# Patient Record
Sex: Female | Born: 1945
Health system: Southern US, Community
[De-identification: ages and names within clinical notes are randomized; demographics above are authoritative.]

## PROBLEM LIST (undated history)

## (undated) DIAGNOSIS — K219 Gastro-esophageal reflux disease without esophagitis: Secondary | ICD-10-CM

## (undated) DIAGNOSIS — M069 Rheumatoid arthritis, unspecified: Secondary | ICD-10-CM

## (undated) DIAGNOSIS — F329 Major depressive disorder, single episode, unspecified: Secondary | ICD-10-CM

## (undated) DIAGNOSIS — K228 Other specified diseases of esophagus: Secondary | ICD-10-CM

## (undated) DIAGNOSIS — K2289 Other specified disease of esophagus: Secondary | ICD-10-CM

## (undated) DIAGNOSIS — F32A Depression, unspecified: Secondary | ICD-10-CM

## (undated) DIAGNOSIS — K449 Diaphragmatic hernia without obstruction or gangrene: Secondary | ICD-10-CM

## (undated) HISTORY — DX: Diaphragmatic hernia without obstruction or gangrene: K44.9

## (undated) HISTORY — DX: Gastro-esophageal reflux disease without esophagitis: K21.9

## (undated) HISTORY — DX: Rheumatoid arthritis, unspecified: M06.9

## (undated) HISTORY — DX: Other specified disease of esophagus: K22.89

## (undated) HISTORY — DX: Depression, unspecified: F32.A

---
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
## 2006-10-11 HISTORY — PX: REPLACEMENT TOTAL KNEE: SUR1224

## 2017-10-10 DIAGNOSIS — I10 Essential (primary) hypertension: Secondary | ICD-10-CM

## 2017-10-10 DIAGNOSIS — Z7901 Long term (current) use of anticoagulants: Secondary | ICD-10-CM | POA: Insufficient documentation

## 2017-10-10 DIAGNOSIS — I480 Paroxysmal atrial fibrillation: Secondary | ICD-10-CM

## 2017-10-10 HISTORY — DX: Long term (current) use of anticoagulants: Z79.01

## 2017-10-10 HISTORY — DX: Paroxysmal atrial fibrillation: I48.0

## 2017-10-10 HISTORY — DX: Essential (primary) hypertension: I10

## 2018-05-01 DIAGNOSIS — R41840 Attention and concentration deficit: Secondary | ICD-10-CM | POA: Diagnosis not present

## 2018-05-01 DIAGNOSIS — I10 Essential (primary) hypertension: Secondary | ICD-10-CM | POA: Diagnosis not present

## 2018-05-01 DIAGNOSIS — R110 Nausea: Secondary | ICD-10-CM | POA: Diagnosis not present

## 2018-05-01 DIAGNOSIS — F329 Major depressive disorder, single episode, unspecified: Secondary | ICD-10-CM | POA: Diagnosis not present

## 2018-05-01 DIAGNOSIS — Z79899 Other long term (current) drug therapy: Secondary | ICD-10-CM | POA: Diagnosis not present

## 2018-05-01 DIAGNOSIS — R7303 Prediabetes: Secondary | ICD-10-CM | POA: Diagnosis not present

## 2018-05-01 DIAGNOSIS — E7800 Pure hypercholesterolemia, unspecified: Secondary | ICD-10-CM | POA: Diagnosis not present

## 2018-05-16 DIAGNOSIS — E7800 Pure hypercholesterolemia, unspecified: Secondary | ICD-10-CM | POA: Diagnosis not present

## 2018-05-16 DIAGNOSIS — K219 Gastro-esophageal reflux disease without esophagitis: Secondary | ICD-10-CM | POA: Diagnosis not present

## 2018-05-16 DIAGNOSIS — I10 Essential (primary) hypertension: Secondary | ICD-10-CM | POA: Diagnosis not present

## 2018-05-16 DIAGNOSIS — F329 Major depressive disorder, single episode, unspecified: Secondary | ICD-10-CM | POA: Diagnosis not present

## 2019-04-06 DIAGNOSIS — M1712 Unilateral primary osteoarthritis, left knee: Secondary | ICD-10-CM | POA: Diagnosis not present

## 2019-04-06 DIAGNOSIS — M545 Low back pain: Secondary | ICD-10-CM | POA: Diagnosis not present

## 2019-05-24 DIAGNOSIS — I340 Nonrheumatic mitral (valve) insufficiency: Secondary | ICD-10-CM | POA: Diagnosis not present

## 2019-05-24 DIAGNOSIS — J811 Chronic pulmonary edema: Secondary | ICD-10-CM | POA: Diagnosis not present

## 2019-05-24 DIAGNOSIS — R0789 Other chest pain: Secondary | ICD-10-CM | POA: Diagnosis not present

## 2019-05-24 DIAGNOSIS — I10 Essential (primary) hypertension: Secondary | ICD-10-CM | POA: Diagnosis not present

## 2019-05-24 DIAGNOSIS — K449 Diaphragmatic hernia without obstruction or gangrene: Secondary | ICD-10-CM | POA: Diagnosis not present

## 2019-05-24 DIAGNOSIS — R918 Other nonspecific abnormal finding of lung field: Secondary | ICD-10-CM | POA: Diagnosis not present

## 2019-05-24 DIAGNOSIS — F419 Anxiety disorder, unspecified: Secondary | ICD-10-CM | POA: Diagnosis not present

## 2019-05-24 DIAGNOSIS — K219 Gastro-esophageal reflux disease without esophagitis: Secondary | ICD-10-CM | POA: Diagnosis not present

## 2019-05-24 DIAGNOSIS — R0602 Shortness of breath: Secondary | ICD-10-CM | POA: Diagnosis not present

## 2019-05-24 DIAGNOSIS — R079 Chest pain, unspecified: Secondary | ICD-10-CM | POA: Diagnosis not present

## 2019-05-24 DIAGNOSIS — R0600 Dyspnea, unspecified: Secondary | ICD-10-CM | POA: Diagnosis not present

## 2019-05-24 DIAGNOSIS — Z79899 Other long term (current) drug therapy: Secondary | ICD-10-CM | POA: Diagnosis not present

## 2019-05-24 DIAGNOSIS — I501 Left ventricular failure: Secondary | ICD-10-CM | POA: Diagnosis not present

## 2019-05-24 DIAGNOSIS — F418 Other specified anxiety disorders: Secondary | ICD-10-CM | POA: Diagnosis not present

## 2019-05-24 DIAGNOSIS — Z03818 Encounter for observation for suspected exposure to other biological agents ruled out: Secondary | ICD-10-CM | POA: Diagnosis not present

## 2019-05-24 DIAGNOSIS — I4891 Unspecified atrial fibrillation: Secondary | ICD-10-CM | POA: Diagnosis not present

## 2019-05-24 DIAGNOSIS — M069 Rheumatoid arthritis, unspecified: Secondary | ICD-10-CM | POA: Diagnosis not present

## 2019-05-24 DIAGNOSIS — E7800 Pure hypercholesterolemia, unspecified: Secondary | ICD-10-CM | POA: Diagnosis not present

## 2019-05-25 DIAGNOSIS — K219 Gastro-esophageal reflux disease without esophagitis: Secondary | ICD-10-CM | POA: Diagnosis not present

## 2019-05-25 DIAGNOSIS — Z79899 Other long term (current) drug therapy: Secondary | ICD-10-CM | POA: Diagnosis not present

## 2019-05-25 DIAGNOSIS — I501 Left ventricular failure: Secondary | ICD-10-CM | POA: Diagnosis not present

## 2019-05-25 DIAGNOSIS — R079 Chest pain, unspecified: Secondary | ICD-10-CM | POA: Diagnosis not present

## 2019-05-25 DIAGNOSIS — I4891 Unspecified atrial fibrillation: Secondary | ICD-10-CM | POA: Diagnosis not present

## 2019-05-25 DIAGNOSIS — I340 Nonrheumatic mitral (valve) insufficiency: Secondary | ICD-10-CM | POA: Diagnosis not present

## 2019-05-25 DIAGNOSIS — F419 Anxiety disorder, unspecified: Secondary | ICD-10-CM | POA: Diagnosis not present

## 2019-05-25 DIAGNOSIS — E7800 Pure hypercholesterolemia, unspecified: Secondary | ICD-10-CM | POA: Diagnosis not present

## 2019-05-25 DIAGNOSIS — R0600 Dyspnea, unspecified: Secondary | ICD-10-CM | POA: Diagnosis not present

## 2019-05-25 DIAGNOSIS — M069 Rheumatoid arthritis, unspecified: Secondary | ICD-10-CM | POA: Diagnosis not present

## 2019-05-25 DIAGNOSIS — F418 Other specified anxiety disorders: Secondary | ICD-10-CM | POA: Diagnosis not present

## 2019-05-25 DIAGNOSIS — J811 Chronic pulmonary edema: Secondary | ICD-10-CM | POA: Diagnosis not present

## 2019-05-25 DIAGNOSIS — I10 Essential (primary) hypertension: Secondary | ICD-10-CM | POA: Diagnosis not present

## 2019-05-26 DIAGNOSIS — M069 Rheumatoid arthritis, unspecified: Secondary | ICD-10-CM | POA: Diagnosis not present

## 2019-05-26 DIAGNOSIS — Z79899 Other long term (current) drug therapy: Secondary | ICD-10-CM | POA: Diagnosis not present

## 2019-05-26 DIAGNOSIS — I4891 Unspecified atrial fibrillation: Secondary | ICD-10-CM | POA: Diagnosis not present

## 2019-05-26 DIAGNOSIS — K219 Gastro-esophageal reflux disease without esophagitis: Secondary | ICD-10-CM | POA: Diagnosis not present

## 2019-05-26 DIAGNOSIS — F419 Anxiety disorder, unspecified: Secondary | ICD-10-CM | POA: Diagnosis not present

## 2019-05-26 DIAGNOSIS — F418 Other specified anxiety disorders: Secondary | ICD-10-CM | POA: Diagnosis not present

## 2019-05-26 DIAGNOSIS — J811 Chronic pulmonary edema: Secondary | ICD-10-CM | POA: Diagnosis not present

## 2019-05-26 DIAGNOSIS — R079 Chest pain, unspecified: Secondary | ICD-10-CM | POA: Diagnosis not present

## 2019-05-26 DIAGNOSIS — I501 Left ventricular failure: Secondary | ICD-10-CM | POA: Diagnosis not present

## 2019-05-26 DIAGNOSIS — R0600 Dyspnea, unspecified: Secondary | ICD-10-CM | POA: Diagnosis not present

## 2019-05-26 DIAGNOSIS — I10 Essential (primary) hypertension: Secondary | ICD-10-CM | POA: Diagnosis not present

## 2019-05-26 DIAGNOSIS — E7800 Pure hypercholesterolemia, unspecified: Secondary | ICD-10-CM | POA: Diagnosis not present

## 2019-06-05 DIAGNOSIS — Z6837 Body mass index (BMI) 37.0-37.9, adult: Secondary | ICD-10-CM | POA: Diagnosis not present

## 2019-06-05 DIAGNOSIS — G8929 Other chronic pain: Secondary | ICD-10-CM | POA: Diagnosis not present

## 2019-06-05 DIAGNOSIS — Z79899 Other long term (current) drug therapy: Secondary | ICD-10-CM | POA: Diagnosis not present

## 2019-06-05 DIAGNOSIS — E669 Obesity, unspecified: Secondary | ICD-10-CM | POA: Diagnosis not present

## 2019-06-05 DIAGNOSIS — I4891 Unspecified atrial fibrillation: Secondary | ICD-10-CM | POA: Diagnosis not present

## 2019-06-05 DIAGNOSIS — F419 Anxiety disorder, unspecified: Secondary | ICD-10-CM | POA: Diagnosis not present

## 2019-06-05 DIAGNOSIS — R5383 Other fatigue: Secondary | ICD-10-CM | POA: Diagnosis not present

## 2019-06-28 ENCOUNTER — Ambulatory Visit: Payer: Medicare PPO | Admitting: Cardiology

## 2019-07-24 ENCOUNTER — Ambulatory Visit: Payer: Medicare PPO | Admitting: Cardiology

## 2019-08-13 ENCOUNTER — Ambulatory Visit: Payer: Medicare PPO | Admitting: Cardiology

## 2019-08-14 ENCOUNTER — Other Ambulatory Visit: Payer: Self-pay | Admitting: *Deleted

## 2019-08-14 ENCOUNTER — Encounter: Payer: Self-pay | Admitting: *Deleted

## 2019-08-14 ENCOUNTER — Encounter: Payer: Self-pay | Admitting: Cardiology

## 2019-08-14 DIAGNOSIS — K228 Other specified diseases of esophagus: Secondary | ICD-10-CM | POA: Insufficient documentation

## 2019-08-14 DIAGNOSIS — K449 Diaphragmatic hernia without obstruction or gangrene: Secondary | ICD-10-CM | POA: Insufficient documentation

## 2019-08-14 DIAGNOSIS — K219 Gastro-esophageal reflux disease without esophagitis: Secondary | ICD-10-CM | POA: Insufficient documentation

## 2019-08-14 DIAGNOSIS — K2289 Other specified disease of esophagus: Secondary | ICD-10-CM | POA: Insufficient documentation

## 2019-08-14 DIAGNOSIS — M069 Rheumatoid arthritis, unspecified: Secondary | ICD-10-CM | POA: Insufficient documentation

## 2019-08-14 DIAGNOSIS — F32A Depression, unspecified: Secondary | ICD-10-CM | POA: Insufficient documentation

## 2019-08-14 DIAGNOSIS — F329 Major depressive disorder, single episode, unspecified: Secondary | ICD-10-CM | POA: Insufficient documentation

## 2019-08-15 ENCOUNTER — Ambulatory Visit (INDEPENDENT_AMBULATORY_CARE_PROVIDER_SITE_OTHER): Payer: Medicare PPO | Admitting: Cardiology

## 2019-08-15 ENCOUNTER — Encounter: Payer: Self-pay | Admitting: Cardiology

## 2019-08-15 ENCOUNTER — Other Ambulatory Visit: Payer: Self-pay

## 2019-08-15 ENCOUNTER — Encounter: Payer: Self-pay | Admitting: *Deleted

## 2019-08-15 VITALS — BP 124/84 | HR 69 | Ht 63.75 in | Wt 225.0 lb

## 2019-08-15 DIAGNOSIS — R5383 Other fatigue: Secondary | ICD-10-CM | POA: Diagnosis not present

## 2019-08-15 DIAGNOSIS — I10 Essential (primary) hypertension: Secondary | ICD-10-CM

## 2019-08-15 DIAGNOSIS — E669 Obesity, unspecified: Secondary | ICD-10-CM

## 2019-08-15 DIAGNOSIS — I480 Paroxysmal atrial fibrillation: Secondary | ICD-10-CM

## 2019-08-15 DIAGNOSIS — E782 Mixed hyperlipidemia: Secondary | ICD-10-CM

## 2019-08-15 DIAGNOSIS — R0602 Shortness of breath: Secondary | ICD-10-CM | POA: Diagnosis not present

## 2019-08-15 DIAGNOSIS — I340 Nonrheumatic mitral (valve) insufficiency: Secondary | ICD-10-CM

## 2019-08-15 MED ORDER — POTASSIUM CHLORIDE CRYS ER 20 MEQ PO TBCR: EXTENDED_RELEASE_TABLET | ORAL | 1 refills | Status: DC

## 2019-08-15 MED ORDER — FUROSEMIDE 20 MG PO TABS: ORAL_TABLET | ORAL | 1 refills | Status: DC

## 2019-08-15 NOTE — Patient Instructions (Signed)
Medication Instructions:  Your physician has recommended you make the following change in your medication:   START: Furosemide 20 mg Take 1 tab on Tues ,Thurs And Saturday x 2 weeks START: Potassium 20 meq Take 1 tab on Tues, Thurs and Saturday x 2 weeks  TAKE: Additional Toprol XL 25 mg if Heart rate is greater than 120. Check BP first to be sure top number is greater than 110  *If you need a refill on your cardiac medications before your next appointment, please call your pharmacy*  Lab Work: Your physician recommends that you return for lab work in: TODAY BMP,CBC,TSH,Liver  If you have labs (blood work) drawn today and your tests are completely normal, you will receive your results only by: Marland Kitchen MyChart Message (if you have MyChart) OR . A paper copy in the mail If you have any lab test that is abnormal or we need to change your treatment, we will call you to review the results.  Testing/Procedures: Your physician has recommended that you have a Cardioversion (DCCV). Electrical Cardioversion uses a jolt of electricity to your heart either through paddles or wired patches attached to your chest. This is a controlled, usually prescheduled, procedure. Defibrillation is done under light anesthesia in the hospital, and you usually go home the day of the procedure. This is done to get your heart back into a normal rhythm. You are not awake for the procedure. Please see the instruction sheet given to you today.  Dear Penny Hines You are scheduled for a TEE/Cardioversion/TEE Cardioversion at [facility] with Dr. Bettina Gavia.  Please arrive at the [facility] at the time you are instructed when we call you.  DIET: Nothing to eat or drink after midnight except a sip of water with medications (see medication instructions below)  Medication Instructions:   Continue your anticoagulant: ELIQUIS 5 mg You will need to continue your anticoagulant after your procedure until you  are told by your   Provider that it is safe to stop   Labs: None needed   You must have a responsible person to drive you home and stay in the waiting area during your procedure. Failure to do so could result in cancellation.  Bring your insurance cards.  *Special Note: Every effort is made to have your procedure done on time. Occasionally there are emergencies that occur at the hospital that may cause delays. Please be patient if a delay does occur.   Pulmonary function tests will be scheduled at [place] will call you with appointment time and date.  Follow-Up: At [facility], you and your health needs are our priority.  As part of our continuing mission to provide you with exceptional heart care, we have created designated Provider Care Teams.  These Care Teams include your primary Cardiologist (physician) and Advanced Practice Providers (APPs -  Physician Assistants and Nurse Practitioners) who all work together to provide you with the care you need, when you need it.  Your next appointment:   1 month  The format for your next appointment:   In Person  Provider:   Berniece Salines, DO  Other Instructions  Electrical Cardioversion  Electrical cardioversion is the delivery of a jolt of electricity to restore a normal rhythm to the heart. A rhythm that is too fast or is not regular keeps the heart from pumping well. In this procedure, sticky patches or metal paddles are placed on the chest to deliver electricity to the heart from a device. This procedure  may be done in an emergency if:  There is low or no blood pressure as a result of the heart rhythm.  Normal rhythm must be restored as fast as possible to protect the brain and heart from further damage.  It may save a life. This procedure may also be done for irregular or fast heart rhythms that are not immediately life-threatening. Tell a health care provider about:  Any allergies you have.  All medicines you are taking,  including vitamins, herbs, eye drops, creams, and over-the-counter medicines.  Any problems you or family members have had with anesthetic medicines.  Any blood disorders you have.  Any surgeries you have had.  Any medical conditions you have.  Whether you are pregnant or may be pregnant. What are the risks? Generally, this is a safe procedure. However, problems may occur, including:  Allergic reactions to medicines.  A blood clot that breaks free and travels to other parts of your body.  The possible return of an abnormal heart rhythm within hours or days after the procedure.  Your heart stopping (cardiac arrest). This is rare. What happens before the procedure? Medicines  Your health care provider may have you start taking: ? Blood-thinning medicines (anticoagulants) so your blood does not clot as easily. ? Medicines may be given to help stabilize your heart rate and rhythm.  Ask your health care provider about changing or stopping your regular medicines. This is especially important if you are taking diabetes medicines or blood thinners. General instructions  Plan to have someone take you home from the hospital or clinic.  If you will be going home right after the procedure, plan to have someone with you for 24 hours.  Follow instructions from your health care provider about eating or drinking restrictions. What happens during the procedure?  To lower your risk of infection: ? Your health care team will wash or sanitize their hands. ? Your skin will be washed with soap.  An IV tube will be inserted into one of your veins.  You will be given a medicine to help you relax (sedative).  Sticky patches (electrodes) or metal paddles may be placed on your chest.  An electrical shock will be delivered. The procedure may vary among health care providers and hospitals. What happens after the procedure?   Your blood pressure, heart rate, breathing rate, and blood oxygen  level will be monitored until the medicines you were given have worn off.  Do not drive for 24 hours if you were given a sedative.  Your heart rhythm will be watched to make sure it does not change. This information is not intended to replace advice given to you by your health care provider. Make sure you discuss any questions you have with your health care provider. Document Released: 09/17/2002 Document Revised: 09/09/2017 Document Reviewed: 04/02/2016 Elsevier Patient Education  2020 [employer].

## 2019-08-15 NOTE — Progress Notes (Signed)
o Cardiology Office Note:    Date:  08/15/2019   ID:  Penny Hines, DOB 11-28-1945, MRN [id number]  PCP:  Penny Guest, NP  Cardiologist:  Berniece Salines, DO  Electrophysiologist:  None   Referring MD: Ernestene Kiel, MD   Chief Complaint  Patient presents with   Atrial Fibrillation   History of Present Illness:    Penny Hines is a 73 y.o. female with a hx of hypertension, rheumatoid arthritis paroxysmal atrial fibrillation currently on Eliquis 5 mg twice daily, the patient was diagnosed with her atrial fibrillation in February 2019, hyperlipidemia, obesity. The patient is here today to establish cardiac care.  She was advised by her primary care physician see a cardiologist given her atrial fibrillation.  She was admitted at the [facility] back in August for atrial fibrillation with rapid ventricular rate as well as acute diastolic heart failure.  Today she tells me that at home she believes her heart rate is not well controlled. She has intermittent heart rate which is elevated in the 130s. In addition she tells me that she is short of breath when this is occurring. She denies any chest pain, lightheadedness or dizziness.   Past Medical History:  Diagnosis Date   Current use of long term anticoagulation 10/10/2017   Depression    Esophageal dilatation    Essential hypertension 10/10/2017   GERD (gastroesophageal reflux disease)    Hiatal hernia    Paroxysmal atrial fibrillation ([place]) 10/10/2017   Rheumatoid arthritis ([place])     Past Surgical History:  Procedure Laterality Date   CESAREAN SECTION  1976   REPLACEMENT TOTAL KNEE Right 2008    Current Medications: Current Meds  Medication Sig   clonazePAM (KLONOPIN) 0.5 MG tablet 0.5 mg 2 (two) times daily as needed.   diltiazem (CARDIZEM CD) 240 MG 24 hr capsule 240 mg daily.   ELIQUIS 5 MG TABS tablet Take 5 mg by mouth 2 (two) times daily.   loratadine (CLARITIN) 10 MG tablet Take  10 mg by mouth daily.   metoprolol succinate (TOPROL-XL) 50 MG 24 hr tablet Take 50 mg by mouth daily.   omeprazole (PRILOSEC) 20 MG capsule Take 20 mg by mouth daily.   pravastatin (PRAVACHOL) 10 MG tablet Take 10 mg by mouth daily.   sertraline (ZOLOFT) 100 MG tablet Take 100 mg by mouth daily.     Allergies:   Patient has no known allergies.   Social History   Socioeconomic History   Marital status: Single    Spouse name: Not on file   Number of children: Not on file   Years of education: Not on file   Highest education level: Not on file  Occupational History   Not on file  Social Needs   Financial resource strain: Not on file   Food insecurity    Worry: Not on file    Inability: Not on file   Transportation needs    Medical: Not on file    Non-medical: Not on file  Tobacco Use   Smoking status: Never Smoker   Smokeless tobacco: Never Used  Substance and Sexual Activity   Alcohol use: Yes    Comment: Occasional   Drug use: Not Currently   Sexual activity: Not on file  Lifestyle   Physical activity    Days per week: Not on file    Minutes per session: Not on file   Stress: Not on file  Relationships   Social connections  Talks on phone: Not on file    Gets together: Not on file    Attends religious service: Not on file    Active member of club or organization: Not on file    Attends meetings of clubs or organizations: Not on file    Relationship status: Not on file  Other Topics Concern   Not on file  Social History Narrative   Not on file     Family History: The patient's family history includes Diabetes in her brother and sister; Heart disease in her father; Hypertension in her father and mother; Lung cancer in her brother; Rheum arthritis in her sister; Stroke in her maternal grandmother.  ROS:   Review of Systems  Constitution: Negative for decreased appetite, fever and weight gain.  HENT: Negative for congestion, ear  discharge, hoarse voice and sore throat.   Eyes: Negative for discharge, redness, vision loss in right eye and visual halos.  Cardiovascular: Reports shortness of breath.  Negative for chest pain, leg swelling, orthopnea and palpitations.  Respiratory: Negative for cough, hemoptysis, shortness of breath and snoring.   Endocrine: Negative for heat intolerance and polyphagia.   Hematologic/Lymphatic: Negative for bleeding problem. Does not bruise/bleed easily.  Skin: Negative for flushing, nail changes, rash and suspicious lesions.  Musculoskeletal: Negative for arthritis, joint pain, muscle cramps, myalgias, neck pain and stiffness.  Gastrointestinal: Negative for abdominal pain, bowel incontinence, diarrhea and excessive appetite.  Genitourinary: Negative for decreased libido, genital sores and incomplete emptying.  Neurological: Negative for brief paralysis, focal weakness, headaches and loss of balance.  Psychiatric/Behavioral: Negative for altered mental status, depression and suicidal ideas.  Allergic/Immunologic: Negative for HIV exposure and persistent infections.    EKGs/Labs/Other Studies Reviewed:    The following studies were reviewed today:   EKG:  The ekg ordered today demonstrates atrial fibrillation with rapid ventricular rate heart rate 111 bpm.  No prior EKG for comparison  Transthoracic echocardiogram performed at [facility] on May 24, 2019 showed evidence of left atrial systolic function which is low normal.  EF 50 to 55%.  The left atrium was mildly dilated.  The right atrium was mildly enlarged.  There is moderate amount regurgitation present.  Recent Labs: No results found for requested labs within last 8760 hours.  CBC: WBC 6.7, hemoglobin 13.0, hematocrit 40.9, platelet 351 Chemistry: Glucose 111, BUN 24, creatinine 0.89, sodium 140, potassium 4.2, chloride 101, bicarb 22, calcium 10.0, total protein 7.2, albumin 4.3, total globulin 2.9, total bilirubin less  than 0.2, alk phos 7.9, AST 16, ALT 13 Recent Lipid Panel No results found for: CHOL, TRIG, HDL, CHOLHDL, VLDL, LDLCALC, LDLDIRECT  Physical Exam:    VS:  BP 124/84 (BP Location: Right Arm, Patient Position: Sitting, Cuff Size: Large)    Pulse 69    Ht 5' 3.75" (1.619 m)    Wt 225 lb (102.1 kg)    SpO2 97%    BMI 38.92 kg/m     Wt Readings from Last 3 Encounters:  08/15/19 225 lb (102.1 kg)  06/05/19 216 lb (98 kg)     GEN: Obese lady, well nourished, well developed in no acute distress HEENT: Normal NECK: No JVD; No carotid bruits LYMPHATICS: No lymphadenopathy CARDIAC: S1S2 noted,RRR, no murmurs, rubs, gallops RESPIRATORY:  Clear to auscultation without rales, wheezing or rhonchi  ABDOMEN: Soft, non-tender, non-distended, +bowel sounds, no guarding. EXTREMITIES: Trace ankle edema, No cyanosis, no clubbing MUSCULOSKELETAL: No deformity  SKIN: Warm and dry NEUROLOGIC:  Alert and oriented x  3, non-focal PSYCHIATRIC:  Normal affect, good insight  ASSESSMENT:    1. Paroxysmal atrial fibrillation (HCC)   2. Shortness of breath   3. Moderate mitral regurgitation   4. Essential hypertension   5. Obesity (BMI 30-39.9)   6. Fatigue, unspecified type   7. Mixed hyperlipidemia    PLAN:    1.  Symptomatic atrial fibrillation-I do believe that part of her symptoms is contributory the fact that she is in atrial fibrillation.  Therefore we will start with rhythm control strategy at this time.  She has been taking her Eliquis 5 mg twice daily for 2 months now with no interruption, therefrom, schedule patient for DC cardioversion at an attempt to put her back in sinus rhythm.  In addition I will get a pulmonary function test, TSH as well as LFTs in preparation for use of amiodarone if we need antiarrhythmics to maintain her sinus rhythm.  Have educated patient about this procedure and also informed her that there could be small chance that she does not convert to sinus rhythm.  She is not  fully rate control at home on her Toprol XL 50 mg and Cardizem CD to 40 mg daily.  I have advised patient that if her heart rate goes greater than 120 bpm after taking her current rate control agents to take her blood pressure as long as this is greater than 110 to take an additional Toprol-XL 25 mg.  2.  Shortness of breath-I suspect that this is likely in the setting of her symptomatic atrial fibrillation.  However due to her recent position for acute on chronic diastolic heart failure I am going to do cautious diuretics.  Therefore trial of Lasix 20 mg Tuesday Thursday and Saturdays for 2 weeks has been ordered.  In addition she will take potassium chloride 20 mEq for 2 weeks as well.  If getting back into sinus rhythm as well as the trial of diuretics does not improve his symptoms it would be reasonable at that time to rule out coronary artery disease.  3.  Moderate mitral regurgitation her echocardiogram from [facility] was reviewed, with the patient.  All her questions were answered.  Lasix as above.  4.  Hyperlipidemia patient will continue on her current dose of pravastatin.  5.  Obesity-the patient understands the need to lose weight with diet and exercise. We have discussed specific strategies for this.  6.  Blood work will be performed today.  Includes CBC, BMP, TSH, LFT.  The patient is in agreement with the above plan. The patient left the office in stable condition.  The patient will follow up in 1 month.   Medication Adjustments/Labs and Tests Ordered: Current medicines are reviewed at length with the patient today.  Concerns regarding medicines are outlined above.  Orders Placed This Encounter  Procedures   Basic Metabolic Panel (BMET)   CBC   TSH   Hepatic function panel   EKG 12-Lead   Pulmonary function test   Meds ordered this encounter  Medications   furosemide (LASIX) 20 MG tablet    Sig: Take 1 tab (20 mg) on Tues, Thurs, and Saturday for 2 weeks    Dispense:   6 tablet    Refill:  1   potassium chloride SA (KLOR-CON M20) 20 MEQ tablet    Sig: Take 1 tab 20 meq on Tues,Thurs, and Saturday x 2 weeks    Dispense:  6 tablet    Refill:  1    Patient Instructions  Medication Instructions:  Your physician has recommended you make the following change in your medication:   START: Furosemide 20 mg Take 1 tab on Tues ,Thurs And Saturday x 2 weeks START: Potassium 20 meq Take 1 tab on Tues, Thurs and Saturday x 2 weeks  TAKE: Additional Toprol XL 25 mg if Heart rate is greater than 120. Check BP first to be sure top number is greater than 110  *If you need a refill on your cardiac medications before your next appointment, please call your pharmacy*  Lab Work: Your physician recommends that you return for lab work in: [place]  If you have labs (blood work) drawn today and your tests are completely normal, you will receive your results only by:  [place] (if you have [place]) OR  A paper copy in the mail If you have any lab test that is abnormal or we need to change your treatment, we will call you to review the results.  Testing/Procedures: Your physician has recommended that you have a Cardioversion (DCCV). Electrical Cardioversion uses a jolt of electricity to your heart either through paddles or wired patches attached to your chest. This is a controlled, usually prescheduled, procedure. Defibrillation is done under light anesthesia in the hospital, and you usually go home the day of the procedure. This is done to get your heart back into a normal rhythm. You are not awake for the procedure. Please see the instruction sheet given to you today.  Dear Burnard Bunting You are scheduled for a TEE/Cardioversion/TEE Cardioversion at [facility] with Dr. Bettina Gavia.  Please arrive at the [facility] at the time you are instructed when we call you.  DIET: Nothing to eat or drink after midnight except a sip of water with  medications (see medication instructions below)  Medication Instructions:   Continue your anticoagulant: ELIQUIS 5 mg You will need to continue your anticoagulant after your procedure until you  are told by your  Provider that it is safe to stop   Labs: None needed   You must have a responsible person to drive you home and stay in the waiting area during your procedure. Failure to do so could result in cancellation.  Bring your insurance cards.  *Special Note: Every effort is made to have your procedure done on time. Occasionally there are emergencies that occur at the hospital that may cause delays. Please be patient if a delay does occur.   Pulmonary function tests will be scheduled at [place] will call you with appointment time and date.  Follow-Up: At [facility], you and your health needs are our priority.  As part of our continuing mission to provide you with exceptional heart care, we have created designated Provider Care Teams.  These Care Teams include your primary Cardiologist (physician) and Advanced Practice Providers (APPs -  Physician Assistants and Nurse Practitioners) who all work together to provide you with the care you need, when you need it.  Your next appointment:   1 month  The format for your next appointment:   In Person  Provider:   Berniece Salines, DO  Other Instructions  Electrical Cardioversion  Electrical cardioversion is the delivery of a jolt of electricity to restore a normal rhythm to the heart. A rhythm that is too fast or is not regular keeps the heart from pumping well. In this procedure, sticky patches or metal paddles are placed on the chest to deliver electricity to the heart from a device. This procedure  may be done in an emergency if:  There is low or no blood pressure as a result of the heart rhythm.  Normal rhythm must be restored as fast as possible to protect the brain and heart from further damage.  It may  save a life. This procedure may also be done for irregular or fast heart rhythms that are not immediately life-threatening. Tell a health care provider about:  Any allergies you have.  All medicines you are taking, including vitamins, herbs, eye drops, creams, and over-the-counter medicines.  Any problems you or family members have had with anesthetic medicines.  Any blood disorders you have.  Any surgeries you have had.  Any medical conditions you have.  Whether you are pregnant or may be pregnant. What are the risks? Generally, this is a safe procedure. However, problems may occur, including:  Allergic reactions to medicines.  A blood clot that breaks free and travels to other parts of your body.  The possible return of an abnormal heart rhythm within hours or days after the procedure.  Your heart stopping (cardiac arrest). This is rare. What happens before the procedure? Medicines  Your health care provider may have you start taking: ? Blood-thinning medicines (anticoagulants) so your blood does not clot as easily. ? Medicines may be given to help stabilize your heart rate and rhythm.  Ask your health care provider about changing or stopping your regular medicines. This is especially important if you are taking diabetes medicines or blood thinners. General instructions  Plan to have someone take you home from the hospital or clinic.  If you will be going home right after the procedure, plan to have someone with you for 24 hours.  Follow instructions from your health care provider about eating or drinking restrictions. What happens during the procedure?  To lower your risk of infection: ? Your health care team will wash or sanitize their hands. ? Your skin will be washed with soap.  An IV tube will be inserted into one of your veins.  You will be given a medicine to help you relax (sedative).  Sticky patches (electrodes) or metal paddles may be placed on your  chest.  An electrical shock will be delivered. The procedure may vary among health care providers and hospitals. What happens after the procedure?   Your blood pressure, heart rate, breathing rate, and blood oxygen level will be monitored until the medicines you were given have worn off.  Do not drive for 24 hours if you were given a sedative.  Your heart rhythm will be watched to make sure it does not change. This information is not intended to replace advice given to you by your health care provider. Make sure you discuss any questions you have with your health care provider. Document Released: 09/17/2002 Document Revised: 09/09/2017 Document Reviewed: 04/02/2016 Elsevier Patient Education  2020 [employer].      Adopting a Healthy Lifestyle.  Know what a healthy weight is for you (roughly BMI <25) and aim to maintain this   Aim for 7+ servings of fruits and vegetables daily   65-80+ fluid ounces of water or unsweet tea for healthy kidneys   Limit to max 1 drink of alcohol per day; avoid smoking/tobacco   Limit animal fats in diet for cholesterol and heart health - choose grass fed whenever available   Avoid highly processed foods, and foods high in saturated/trans fats   Aim for low stress - take time to unwind and care for  your mental health   Aim for 150 min of moderate intensity exercise weekly for heart health, and weights twice weekly for bone health   Aim for 7-9 hours of sleep daily   When it comes to diets, agreement about the perfect plan isnt easy to find, even among the experts. Experts at the [place] developed an idea known as the Healthy Eating Plate. Just imagine a plate divided into logical, healthy portions.   The emphasis is on diet quality:   Load up on vegetables and fruits - one-half of your plate: Aim for color and variety, and remember that potatoes dont count.   Go for whole grains - one-quarter of your plate: Whole  wheat, barley, wheat berries, quinoa, oats, brown rice, and foods made with them. If you want pasta, go with whole wheat pasta.   Protein power - one-quarter of your plate: Fish, chicken, beans, and nuts are all healthy, versatile protein sources. Limit red meat.   The diet, however, does go beyond the plate, offering a few other suggestions.   Use healthy plant oils, such as olive, canola, soy, corn, sunflower and peanut. Check the labels, and avoid partially hydrogenated oil, which have unhealthy trans fats.   If youre thirsty, drink water. Coffee and tea are good in moderation, but skip sugary drinks and limit milk and dairy products to one or two daily servings.   The type of carbohydrate in the diet is more important than the amount. Some sources of carbohydrates, such as vegetables, fruits, whole grains, and beans-are healthier than others.   Finally, stay active  Signed, Berniece Salines, DO  08/15/2019 9:48 AM    [place]

## 2019-08-16 LAB — HEPATIC FUNCTION PANEL
ALT: 10 IU/L (ref 0–32)
AST: 20 IU/L (ref 0–40)
Albumin: 4.1 g/dL (ref 3.7–4.7)
Alkaline Phosphatase: 74 IU/L (ref 39–117)
Bilirubin Total: 0.3 mg/dL (ref 0.0–1.2)
Bilirubin, Direct: 0.08 mg/dL (ref 0.00–0.40)
Total Protein: 6.8 g/dL (ref 6.0–8.5)

## 2019-08-16 LAB — BASIC METABOLIC PANEL
BUN/Creatinine Ratio: 26 (ref 12–28)
BUN: 24 mg/dL (ref 8–27)
CO2: 23 mmol/L (ref 20–29)
Calcium: 9.1 mg/dL (ref 8.7–10.3)
Chloride: 103 mmol/L (ref 96–106)
Creatinine, Ser: 0.94 mg/dL (ref 0.57–1.00)
GFR calc Af Amer: 69 mL/min/{1.73_m2} (ref >59)
GFR calc non Af Amer: 60 mL/min/{1.73_m2} (ref >59)
Glucose: 106 mg/dL — ABNORMAL HIGH (ref 65–99)
Potassium: 4.3 mmol/L (ref 3.5–5.2)
Sodium: 140 mmol/L (ref 134–144)

## 2019-08-16 LAB — CBC
Hematocrit: 37.2 % (ref 34.0–46.6)
Hemoglobin: 11.6 g/dL (ref 11.1–15.9)
MCH: 28.2 pg (ref 26.6–33.0)
MCHC: 31.2 g/dL — ABNORMAL LOW (ref 31.5–35.7)
MCV: 91 fL (ref 79–97)
Platelets: 323 10*3/uL (ref 150–450)
RBC: 4.11 x10E6/uL (ref 3.77–5.28)
RDW: 14.2 % (ref 11.7–15.4)
WBC: 5.3 10*3/uL (ref 3.4–10.8)

## 2019-08-16 LAB — TSH: TSH: 1.41 u[IU]/mL (ref 0.450–4.500)

## 2019-08-17 ENCOUNTER — Telehealth: Payer: Self-pay | Admitting: *Deleted

## 2019-08-17 NOTE — Telephone Encounter (Signed)
Telephone call to patient. Informed of appointment date and time of cardioversion at [facility] on 08/24/19 at 9 AM. She is to be there by 7 am. Nothing to eat/drink except medications after midnight. And be sure to take Eliquis as prescribed. Patient verbalized understanding

## 2019-08-20 ENCOUNTER — Telehealth: Payer: Self-pay | Admitting: *Deleted

## 2019-08-20 DIAGNOSIS — M2550 Pain in unspecified joint: Secondary | ICD-10-CM | POA: Diagnosis not present

## 2019-08-20 DIAGNOSIS — Z6838 Body mass index (BMI) 38.0-38.9, adult: Secondary | ICD-10-CM | POA: Diagnosis not present

## 2019-08-20 DIAGNOSIS — M5442 Lumbago with sciatica, left side: Secondary | ICD-10-CM | POA: Diagnosis not present

## 2019-08-20 DIAGNOSIS — Z79899 Other long term (current) drug therapy: Secondary | ICD-10-CM | POA: Diagnosis not present

## 2019-08-20 DIAGNOSIS — I4891 Unspecified atrial fibrillation: Secondary | ICD-10-CM | POA: Diagnosis not present

## 2019-08-20 DIAGNOSIS — E669 Obesity, unspecified: Secondary | ICD-10-CM | POA: Diagnosis not present

## 2019-08-20 DIAGNOSIS — F329 Major depressive disorder, single episode, unspecified: Secondary | ICD-10-CM | POA: Diagnosis not present

## 2019-08-20 DIAGNOSIS — K219 Gastro-esophageal reflux disease without esophagitis: Secondary | ICD-10-CM | POA: Diagnosis not present

## 2019-08-20 NOTE — Telephone Encounter (Signed)
Telephone call to patient. Informed of PFT's appointment at [facility] on 08/28/19 at 9:30AM. Patient verbalized understanding.

## 2019-08-20 NOTE — Telephone Encounter (Signed)
Left message to return call 

## 2019-08-21 ENCOUNTER — Telehealth: Payer: Self-pay | Admitting: *Deleted

## 2019-08-21 NOTE — Telephone Encounter (Signed)
Telephone call from patient. States was put on Lasix 20 mg TTSa for 2 weeks and not urinating any more than usual. Still SOB. Scheduled for a cardiversion 08/24/19. Please advise.

## 2019-08-24 ENCOUNTER — Telehealth: Payer: Self-pay | Admitting: Cardiology

## 2019-08-24 DIAGNOSIS — Z7901 Long term (current) use of anticoagulants: Secondary | ICD-10-CM | POA: Diagnosis not present

## 2019-08-24 DIAGNOSIS — Z79899 Other long term (current) drug therapy: Secondary | ICD-10-CM | POA: Diagnosis not present

## 2019-08-24 DIAGNOSIS — I480 Paroxysmal atrial fibrillation: Secondary | ICD-10-CM | POA: Diagnosis not present

## 2019-08-24 DIAGNOSIS — M069 Rheumatoid arthritis, unspecified: Secondary | ICD-10-CM | POA: Diagnosis not present

## 2019-08-24 DIAGNOSIS — Z539 Procedure and treatment not carried out, unspecified reason: Secondary | ICD-10-CM | POA: Diagnosis not present

## 2019-08-24 DIAGNOSIS — I10 Essential (primary) hypertension: Secondary | ICD-10-CM | POA: Diagnosis not present

## 2019-08-24 DIAGNOSIS — K219 Gastro-esophageal reflux disease without esophagitis: Secondary | ICD-10-CM | POA: Diagnosis not present

## 2019-08-24 NOTE — Telephone Encounter (Signed)
Telephone call to patient. States was out of AFib when she went for her cardioversion and they sent her home. Wants to know what to do now. Instructed her to stay on Eliquis until told by Dr Harriet Masson to stop and keep her 09/14/19 appointment. She will call if she goes back in to Afib as she knows with her phone app.Patient agreeable and had no further questions.

## 2019-08-24 NOTE — Telephone Encounter (Signed)
Patient went for Cardioversion and she was out of AFIB and they did not do, please call her she has questions regarding future issues with AFIB.

## 2019-09-04 DIAGNOSIS — D649 Anemia, unspecified: Secondary | ICD-10-CM | POA: Diagnosis not present

## 2019-09-14 ENCOUNTER — Ambulatory Visit: Payer: Medicare PPO | Admitting: Cardiology

## 2019-10-01 DIAGNOSIS — G8929 Other chronic pain: Secondary | ICD-10-CM | POA: Diagnosis not present

## 2019-10-01 DIAGNOSIS — Z79899 Other long term (current) drug therapy: Secondary | ICD-10-CM | POA: Diagnosis not present

## 2019-10-01 DIAGNOSIS — F329 Major depressive disorder, single episode, unspecified: Secondary | ICD-10-CM | POA: Diagnosis not present

## 2019-10-01 DIAGNOSIS — F419 Anxiety disorder, unspecified: Secondary | ICD-10-CM | POA: Diagnosis not present

## 2019-10-01 DIAGNOSIS — K219 Gastro-esophageal reflux disease without esophagitis: Secondary | ICD-10-CM | POA: Diagnosis not present

## 2019-10-01 DIAGNOSIS — Z6837 Body mass index (BMI) 37.0-37.9, adult: Secondary | ICD-10-CM | POA: Diagnosis not present

## 2019-11-01 DIAGNOSIS — R6883 Chills (without fever): Secondary | ICD-10-CM | POA: Diagnosis not present

## 2019-11-01 DIAGNOSIS — R519 Headache, unspecified: Secondary | ICD-10-CM | POA: Diagnosis not present

## 2019-11-01 DIAGNOSIS — R05 Cough: Secondary | ICD-10-CM | POA: Diagnosis not present

## 2019-11-01 DIAGNOSIS — Z20828 Contact with and (suspected) exposure to other viral communicable diseases: Secondary | ICD-10-CM | POA: Diagnosis not present

## 2019-11-01 DIAGNOSIS — K591 Functional diarrhea: Secondary | ICD-10-CM | POA: Diagnosis not present

## 2019-11-01 DIAGNOSIS — R0602 Shortness of breath: Secondary | ICD-10-CM | POA: Diagnosis not present

## 2020-01-01 DIAGNOSIS — D6869 Other thrombophilia: Secondary | ICD-10-CM | POA: Diagnosis not present

## 2020-01-01 DIAGNOSIS — Z79899 Other long term (current) drug therapy: Secondary | ICD-10-CM | POA: Diagnosis not present

## 2020-01-01 DIAGNOSIS — M0579 Rheumatoid arthritis with rheumatoid factor of multiple sites without organ or systems involvement: Secondary | ICD-10-CM | POA: Diagnosis not present

## 2020-01-01 DIAGNOSIS — I4891 Unspecified atrial fibrillation: Secondary | ICD-10-CM | POA: Diagnosis not present

## 2020-01-01 DIAGNOSIS — F330 Major depressive disorder, recurrent, mild: Secondary | ICD-10-CM | POA: Diagnosis not present

## 2020-01-01 DIAGNOSIS — I10 Essential (primary) hypertension: Secondary | ICD-10-CM | POA: Diagnosis not present

## 2020-01-01 DIAGNOSIS — E7800 Pure hypercholesterolemia, unspecified: Secondary | ICD-10-CM | POA: Diagnosis not present

## 2020-01-01 DIAGNOSIS — M5442 Lumbago with sciatica, left side: Secondary | ICD-10-CM | POA: Diagnosis not present

## 2020-01-01 DIAGNOSIS — F419 Anxiety disorder, unspecified: Secondary | ICD-10-CM | POA: Diagnosis not present

## 2020-01-01 DIAGNOSIS — G8929 Other chronic pain: Secondary | ICD-10-CM | POA: Diagnosis not present

## 2020-04-22 DIAGNOSIS — F419 Anxiety disorder, unspecified: Secondary | ICD-10-CM | POA: Diagnosis not present

## 2020-04-22 DIAGNOSIS — G8929 Other chronic pain: Secondary | ICD-10-CM | POA: Diagnosis not present

## 2020-04-22 DIAGNOSIS — I4891 Unspecified atrial fibrillation: Secondary | ICD-10-CM | POA: Diagnosis not present

## 2020-04-22 DIAGNOSIS — K219 Gastro-esophageal reflux disease without esophagitis: Secondary | ICD-10-CM | POA: Diagnosis not present

## 2020-04-22 DIAGNOSIS — M0579 Rheumatoid arthritis with rheumatoid factor of multiple sites without organ or systems involvement: Secondary | ICD-10-CM | POA: Diagnosis not present

## 2020-04-22 DIAGNOSIS — E7800 Pure hypercholesterolemia, unspecified: Secondary | ICD-10-CM | POA: Diagnosis not present

## 2020-04-22 DIAGNOSIS — F330 Major depressive disorder, recurrent, mild: Secondary | ICD-10-CM | POA: Diagnosis not present

## 2020-04-22 DIAGNOSIS — I10 Essential (primary) hypertension: Secondary | ICD-10-CM | POA: Diagnosis not present

## 2020-05-26 DIAGNOSIS — H40013 Open angle with borderline findings, low risk, bilateral: Secondary | ICD-10-CM | POA: Diagnosis not present

## 2020-05-26 DIAGNOSIS — H5201 Hypermetropia, right eye: Secondary | ICD-10-CM | POA: Diagnosis not present

## 2020-06-04 DIAGNOSIS — Z20822 Contact with and (suspected) exposure to covid-19: Secondary | ICD-10-CM | POA: Diagnosis not present

## 2020-06-19 ENCOUNTER — Ambulatory Visit: Payer: Medicare PPO | Admitting: Cardiology

## 2020-06-19 ENCOUNTER — Ambulatory Visit (INDEPENDENT_AMBULATORY_CARE_PROVIDER_SITE_OTHER): Payer: Medicare PPO

## 2020-06-19 ENCOUNTER — Other Ambulatory Visit: Payer: Self-pay

## 2020-06-19 ENCOUNTER — Encounter: Payer: Self-pay | Admitting: Cardiology

## 2020-06-19 VITALS — BP 116/78 | HR 72 | Ht 63.75 in | Wt 211.2 lb

## 2020-06-19 DIAGNOSIS — I10 Essential (primary) hypertension: Secondary | ICD-10-CM

## 2020-06-19 DIAGNOSIS — R42 Dizziness and giddiness: Secondary | ICD-10-CM

## 2020-06-19 DIAGNOSIS — E669 Obesity, unspecified: Secondary | ICD-10-CM

## 2020-06-19 DIAGNOSIS — I480 Paroxysmal atrial fibrillation: Secondary | ICD-10-CM

## 2020-06-19 DIAGNOSIS — R079 Chest pain, unspecified: Secondary | ICD-10-CM | POA: Diagnosis not present

## 2020-06-19 DIAGNOSIS — Z7901 Long term (current) use of anticoagulants: Secondary | ICD-10-CM

## 2020-06-19 MED ORDER — NITROGLYCERIN 0.4 MG SL SUBL: 0.4000 mg | SUBLINGUAL_TABLET | SUBLINGUAL | 3 refills | Status: DC | PRN

## 2020-06-19 NOTE — Progress Notes (Signed)
Cardiology Office Note:    Date:  06/19/2020   ID:  Penny Hines, DOB 06-11-1946, MRN [id number]  PCP:  Shirlee Latch, NP  Cardiologist:  Thomasene Ripple, DO  Electrophysiologist:  None   Referring MD: Shirlee Latch, NP   " I am doing well"  History of Present Illness:    Penny Hines is a 74 y.o. female with a hx of hypertension, rheumatoid arthritis, paroxysmal atrial fibrillation on Eliquis, hyperlipidemia and obesity.  At her last visit with plan for DC cardioversion-the patient did go for the cardioversion but was noted to be in sinus rhythm therefore the procedure was canceled.  We advised her to continue on her anticoagulation.  She is here today for follow-up visit.  She tells me today that she has been experiencing intermittent dizziness.  She notes that she has been in and out of A. fib recently but the dizziness is intolerable.  She notes that sometimes when she is sitting or even standing she gets significantly dizzy.  It resolves on its own.  In addition she has had some episodes of chest pain.  She described this as a midsternal chest wheezing and sometimes tightness.  She notes that she gets radiation to her jaw.  Once she called EMS and at that time her blood pressure was significantly elevated which eventually came down.  She was asked to go to the emergency department but she declined at that time.  Recently she had another episode of chest pain.  She states that this episode lasted longer than when she has heartburn from her hiatal hernia.  She is concerned about this.  Past Medical History:  Diagnosis Date  . Current use of long term anticoagulation 10/10/2017  . Depression   . Esophageal dilatation   . Essential hypertension 10/10/2017  . GERD (gastroesophageal reflux disease)   . Hiatal hernia   . Paroxysmal atrial fibrillation (HCC) 10/10/2017  . Rheumatoid arthritis [facility])     Past Surgical History:  Procedure Laterality Date  . CESAREAN SECTION  1976    . REPLACEMENT TOTAL KNEE Right 2008    Current Medications: Current Meds  Medication Sig  . clonazePAM (KLONOPIN) 0.5 MG tablet 0.5 mg 2 (two) times daily as needed.  . DULoxetine (CYMBALTA) 60 MG capsule Take 60 mg by mouth daily.  Marland Kitchen ELIQUIS 5 MG TABS tablet Take 5 mg by mouth daily.   . metoprolol succinate (TOPROL-XL) 50 MG 24 hr tablet Take 50 mg by mouth daily.  Marland Kitchen omeprazole (PRILOSEC) 20 MG capsule Take 20 mg by mouth daily.  . pravastatin (PRAVACHOL) 10 MG tablet Take 10 mg by mouth daily.     Allergies:   Patient has no known allergies.   Social History   Socioeconomic History  . Marital status: Single    Spouse name: Not on file  . Number of children: Not on file  . Years of education: Not on file  . Highest education level: Not on file  Occupational History  . Not on file  Tobacco Use  . Smoking status: Never Smoker  . Smokeless tobacco: Never Used  Substance and Sexual Activity  . Alcohol use: Yes    Comment: Occasional  . Drug use: Not Currently  . Sexual activity: Not on file  Other Topics Concern  . Not on file  Social History Narrative  . Not on file   Social Determinants of Health   Financial Resource Strain:   . Difficulty of Paying Living  Expenses: Not on file  Food Insecurity:   . Worried About Programme researcher, broadcasting/film/video in the Last Year: Not on file  . Ran Out of Food in the Last Year: Not on file  Transportation Needs:   . Lack of Transportation (Medical): Not on file  . Lack of Transportation (Non-Medical): Not on file  Physical Activity:   . Days of Exercise per Week: Not on file  . Minutes of Exercise per Session: Not on file  Stress:   . Feeling of Stress : Not on file  Social Connections:   . Frequency of Communication with Friends and Family: Not on file  . Frequency of Social Gatherings with Friends and Family: Not on file  . Attends Religious Services: Not on file  . Active Member of Clubs or Organizations: Not on file  . Attends Occupational hygienist Meetings: Not on file  . Marital Status: Not on file     Family History: The patient's family history includes Diabetes in her brother and sister; Heart disease in her father; Hypertension in her father and mother; Lung cancer in her brother; Rheum arthritis in her sister; Stroke in her maternal grandmother.  ROS:   Review of Systems  Constitution: Reports dizziness.  Negative for decreased appetite, fever and weight gain.  HENT: Negative for congestion, ear discharge, hoarse voice and sore throat.   Eyes: Negative for discharge, redness, vision loss in right eye and visual halos.  Cardiovascular: Reports chest pain.  Negative for dyspnea on exertion, leg swelling, orthopnea and palpitations.  Respiratory: Negative for cough, hemoptysis, shortness of breath and snoring.   Endocrine: Negative for heat intolerance and polyphagia.  Hematologic/Lymphatic: Negative for bleeding problem. Does not bruise/bleed easily.  Skin: Negative for flushing, nail changes, rash and suspicious lesions.  Musculoskeletal: Negative for arthritis, joint pain, muscle cramps, myalgias, neck pain and stiffness.  Gastrointestinal: Negative for abdominal pain, bowel incontinence, diarrhea and excessive appetite.  Genitourinary: Negative for decreased libido, genital sores and incomplete emptying.  Neurological: Negative for brief paralysis, focal weakness, headaches and loss of balance.  Psychiatric/Behavioral: Negative for altered mental status, depression and suicidal ideas.  Allergic/Immunologic: Negative for HIV exposure and persistent infections.    EKGs/Labs/Other Studies Reviewed:    The following studies were reviewed today:   EKG:  The ekg ordered today demonstrates sinus rhythm, heart rate 72 bpm.  Transthoracic echocardiogram performed at [facility] on May 24, 2019 showed evidence of left atrial systolic function which is low normal.  EF 50 to 55%.  The left atrium was mildly  dilated.  The right atrium was mildly enlarged.  There is moderate amount regurgitation present.  Recent Labs: 08/15/2019: ALT 10; BUN 24; Creatinine, Ser 0.94; Hemoglobin 11.6; Platelets 323; Potassium 4.3; Sodium 140; TSH 1.410  Recent Lipid Panel No results found for: CHOL, TRIG, HDL, CHOLHDL, VLDL, LDLCALC, LDLDIRECT  Physical Exam:    VS:  BP 116/78   Pulse 72   Ht 5' 3.75" (1.619 m)   Wt 211 lb 3.2 oz (95.8 kg)   SpO2 95%   BMI 36.54 kg/m     Wt Readings from Last 3 Encounters:  06/19/20 211 lb 3.2 oz (95.8 kg)  08/15/19 225 lb (102.1 kg)  06/05/19 216 lb (98 kg)     GEN: Well nourished, well developed in no acute distress HEENT: Normal NECK: No JVD; No carotid bruits LYMPHATICS: No lymphadenopathy CARDIAC: S1S2 noted,RRR, no murmurs, rubs, gallops RESPIRATORY:  Clear to auscultation without rales, wheezing  or rhonchi  ABDOMEN: Soft, non-tender, non-distended, +bowel sounds, no guarding. EXTREMITIES: No edema, No cyanosis, no clubbing MUSCULOSKELETAL:  No deformity  SKIN: Warm and dry NEUROLOGIC:  Alert and oriented x 3, non-focal PSYCHIATRIC:  Normal affect, good insight  ASSESSMENT:    1. Chest pain of uncertain etiology   2. Dizziness   3. Essential hypertension   4. Paroxysmal atrial fibrillation (HCC)   5. Current use of long term anticoagulation   6. Obesity (BMI 30-39.9)    PLAN:    She has been experiencing some achy chest pain.  Given her risk factors at this time going to pursue ischemic evaluation.  We have discussed testing options and we both agreed on a pharmacologic nuclear stress test.  I have answered all the question about the stress test.  Sublingual nitroglycerin prescription was sent, its protocol and 911 protocol explained and the patient vocalized understanding questions were answered to the patient's satisfaction.  She also has been experiencing dizziness which is concerning.  Given her history of atrial fibrillation like to make sure that  tachybradycardia syndrome is not a issue here.  And also 1 understand how often is she going in and out of atrial fibrillation.  Going to place a monitor on the patient for about 14 days.    She is on Eliquis twice a day, metoprolol succinate 50 mg daily for atrial fibrillation.  We will continue this for now.  She is in sinus rhythm today.  Hyperlipidemia continue patient her pravastatin.  Obesity-the patient understands the need to lose weight with diet and exercise. We have discussed specific strategies for this.  We will get BMP, mag as well as CBC.  The patient is in agreement with the above plan. The patient left the office in stable condition.  The patient will follow up in 2 months or sooner if needed.   Medication Adjustments/Labs and Tests Ordered: Current medicines are reviewed at length with the patient today.  Concerns regarding medicines are outlined above.  Orders Placed This Encounter  Procedures  . Basic metabolic panel  . CBC  . Magnesium  . MYOCARDIAL PERFUSION IMAGING  . LONG TERM MONITOR (3-14 DAYS)  . EKG 12-Lead   Meds ordered this encounter  Medications  . nitroGLYCERIN (NITROSTAT) 0.4 MG SL tablet    Sig: Place 1 tablet (0.4 mg total) under the tongue every 5 (five) minutes as needed for chest pain.    Dispense:  90 tablet    Refill:  3    Patient Instructions  Medication Instructions:  Your physician has recommended you make the following change in your medication:  START: Nitroglycerin 0.4 mg take one tablet by mouth every 5 minutes as needed for chest pain.  *If you need a refill on your cardiac medications before your next appointment, please call your pharmacy*   Lab Work: Your physician recommends that you return for lab work in: TODAY 'BMP, CBC, Mag If you have labs (blood work) drawn today and your tests are completely normal, you will receive your results only by: Marland Kitchen MyChart Message (if you have MyChart) OR . A paper copy in the mail If  you have any lab test that is abnormal or we need to change your treatment, we will call you to review the results.   Testing/Procedures: A zio monitor was ordered today. It will remain on for 14 days. You will then return monitor and event diary in provided box. It takes 1-2 weeks for report to be  downloaded and returned to [place]. We will call you with the results. If monitor falls off or has orange flashing light, please call Zio for further instructions.     [facility] Nuclear Imaging [address] [place], [place] [address] Phone:  [phone]    Please arrive 15 minutes prior to your appointment time for registration and insurance purposes.  The test will take approximately 3 to 4 hours to complete; you may bring reading material.  If someone comes with you to your appointment, they will need to remain in the main lobby due to limited space in the testing area. **If you are pregnant or breastfeeding, please notify the nuclear lab prior to your appointment**  How to prepare for your Myocardial Perfusion Test: . Do not eat or drink 3 hours prior to your test, except you may have water. . Do not consume products containing caffeine (regular or decaffeinated) 12 hours prior to your test. (ex: coffee, chocolate, sodas, tea). . Do bring a list of your current medications with you.  If not listed below, you may take your medications as normal. . Do wear comfortable clothes (no dresses or overalls) and walking shoes, tennis shoes preferred (No heels or open toe shoes are allowed). . Do NOT wear cologne, perfume, aftershave, or lotions (deodorant is allowed). . If these instructions are not followed, your test will have to be rescheduled.  Please report to [address] for your test.  If you have questions or concerns about your appointment, you can call the [facility] Lab at [phone].  If you cannot keep your appointment, please provide 24  hours notification to the Nuclear Lab, to avoid a possible $50 charge to your account.    Follow-Up: At [facility], you and your health needs are our priority.  As part of our continuing mission to provide you with exceptional heart care, we have created designated Provider Care Teams.  These Care Teams include your primary Cardiologist (physician) and Advanced Practice Providers (APPs -  Physician Assistants and Nurse Practitioners) who all work together to provide you with the care you need, when you need it.  We recommend signing up for the patient portal called "MyChart".  Sign up information is provided on this After Visit Summary.  MyChart is used to connect with patients for Virtual Visits (Telemedicine).  Patients are able to view lab/test results, encounter notes, upcoming appointments, etc.  Non-urgent messages can be sent to your provider as well.   To learn more about what you can do with MyChart, go to [email or web address].    Your next appointment:   3 month(s)  The format for your next appointment:   In Person  Provider:   Thomasene Ripple, DO   Other Instructions      Adopting a Healthy Lifestyle.  Know what a healthy weight is for you (roughly BMI <25) and aim to maintain this   Aim for 7+ servings of fruits and vegetables daily   65-80+ fluid ounces of water or unsweet tea for healthy kidneys   Limit to max 1 drink of alcohol per day; avoid smoking/tobacco   Limit animal fats in diet for cholesterol and heart health - choose grass fed whenever available   Avoid highly processed foods, and foods high in saturated/trans fats   Aim for low stress - take time to unwind and care for your mental health   Aim for 150 min of moderate intensity exercise weekly for heart  health, and weights twice weekly for bone health   Aim for 7-9 hours of sleep daily   When it comes to diets, agreement about the perfect plan isnt easy to find, even among the  experts. Experts at the [facility] [employer] developed an idea known as the Healthy Eating Plate. Just imagine a plate divided into logical, healthy portions.   The emphasis is on diet quality:   Load up on vegetables and fruits - one-half of your plate: Aim for color and variety, and remember that potatoes dont count.   Go for whole grains - one-quarter of your plate: Whole wheat, barley, wheat berries, quinoa, oats, brown rice, and foods made with them. If you want pasta, go with whole wheat pasta.   Protein power - one-quarter of your plate: Fish, chicken, beans, and nuts are all healthy, versatile protein sources. Limit red meat.   The diet, however, does go beyond the plate, offering a few other suggestions.   Use healthy plant oils, such as olive, canola, soy, corn, sunflower and peanut. Check the labels, and avoid partially hydrogenated oil, which have unhealthy trans fats.   If youre thirsty, drink water. Coffee and tea are good in moderation, but skip sugary drinks and limit milk and dairy products to one or two daily servings.   The type of carbohydrate in the diet is more important than the amount. Some sources of carbohydrates, such as vegetables, fruits, whole grains, and beans-are healthier than others.   Finally, stay active  Signed, Thomasene Ripple, DO  06/19/2020 9:16 AM    [facility]

## 2020-06-19 NOTE — Patient Instructions (Signed)
Medication Instructions:  Your physician has recommended you make the following change in your medication:  START: Nitroglycerin 0.4 mg take one tablet by mouth every 5 minutes as needed for chest pain.  *If you need a refill on your cardiac medications before your next appointment, please call your pharmacy*   Lab Work: Your physician recommends that you return for lab work in: TODAY 'BMP, CBC, Mag If you have labs (blood work) drawn today and your tests are completely normal, you will receive your results only by: Marland Kitchen MyChart Message (if you have MyChart) OR . A paper copy in the mail If you have any lab test that is abnormal or we need to change your treatment, we will call you to review the results.   Testing/Procedures: A zio monitor was ordered today. It will remain on for 14 days. You will then return monitor and event diary in provided box. It takes 1-2 weeks for report to be downloaded and returned to [place]. We will call you with the results. If monitor falls off or has orange flashing light, please call Zio for further instructions.     [facility] Nuclear Imaging [address] [place], [place] [address] Phone:  [phone]    Please arrive 15 minutes prior to your appointment time for registration and insurance purposes.  The test will take approximately 3 to 4 hours to complete; you may bring reading material.  If someone comes with you to your appointment, they will need to remain in the main lobby due to limited space in the testing area. **If you are pregnant or breastfeeding, please notify the nuclear lab prior to your appointment**  How to prepare for your Myocardial Perfusion Test: . Do not eat or drink 3 hours prior to your test, except you may have water. . Do not consume products containing caffeine (regular or decaffeinated) 12 hours prior to your test. (ex: coffee, chocolate, sodas, tea). . Do bring a list of your current medications with you.  If not  listed below, you may take your medications as normal. . Do wear comfortable clothes (no dresses or overalls) and walking shoes, tennis shoes preferred (No heels or open toe shoes are allowed). . Do NOT wear cologne, perfume, aftershave, or lotions (deodorant is allowed). . If these instructions are not followed, your test will have to be rescheduled.  Please report to [address] for your test.  If you have questions or concerns about your appointment, you can call the [facility] Imaging Lab at [phone].  If you cannot keep your appointment, please provide 24 hours notification to the Nuclear Lab, to avoid a possible $50 charge to your account.    Follow-Up: At [facility], you and your health needs are our priority.  As part of our continuing mission to provide you with exceptional heart care, we have created designated Provider Care Teams.  These Care Teams include your primary Cardiologist (physician) and Advanced Practice Providers (APPs -  Physician Assistants and Nurse Practitioners) who all work together to provide you with the care you need, when you need it.  We recommend signing up for the patient portal called "MyChart".  Sign up information is provided on this After Visit Summary.  MyChart is used to connect with patients for Virtual Visits (Telemedicine).  Patients are able to view lab/test results, encounter notes, upcoming appointments, etc.  Non-urgent messages can be sent to your provider as well.   To learn more about  what you can do with MyChart, go to [email or web address].    Your next appointment:   3 month(s)  The format for your next appointment:   In Person  Provider:   Thomasene Ripple, DO   Other Instructions

## 2020-06-20 ENCOUNTER — Telehealth: Payer: Self-pay

## 2020-06-20 LAB — BASIC METABOLIC PANEL
BUN/Creatinine Ratio: 16 (ref 12–28)
BUN: 14 mg/dL (ref 8–27)
CO2: 26 mmol/L (ref 20–29)
Calcium: 9.4 mg/dL (ref 8.7–10.3)
Chloride: 101 mmol/L (ref 96–106)
Creatinine, Ser: 0.9 mg/dL (ref 0.57–1.00)
GFR calc Af Amer: 72 mL/min/{1.73_m2} (ref >59)
GFR calc non Af Amer: 63 mL/min/{1.73_m2} (ref >59)
Glucose: 93 mg/dL (ref 65–99)
Potassium: 4.1 mmol/L (ref 3.5–5.2)
Sodium: 140 mmol/L (ref 134–144)

## 2020-06-20 LAB — CBC
Hematocrit: 38.8 % (ref 34.0–46.6)
Hemoglobin: 12.5 g/dL (ref 11.1–15.9)
MCH: 30 pg (ref 26.6–33.0)
MCHC: 32.2 g/dL (ref 31.5–35.7)
MCV: 93 fL (ref 79–97)
Platelets: 301 10*3/uL (ref 150–450)
RBC: 4.17 x10E6/uL (ref 3.77–5.28)
RDW: 13 % (ref 11.7–15.4)
WBC: 5.3 10*3/uL (ref 3.4–10.8)

## 2020-06-20 LAB — MAGNESIUM: Magnesium: 2.1 mg/dL (ref 1.6–2.3)

## 2020-06-20 NOTE — Telephone Encounter (Signed)
Spoke with patient regarding results and recommendation.  Patient verbalizes understanding and is agreeable to plan of care. Advised patient to call back with any issues or concerns.  

## 2020-06-20 NOTE — Telephone Encounter (Signed)
-----   Message from Thomasene Ripple, DO sent at 06/20/2020  8:17 AM EDT ----- Randie Heinz news, normal labs

## 2020-06-23 ENCOUNTER — Telehealth: Payer: Self-pay | Admitting: Cardiology

## 2020-06-23 NOTE — Telephone Encounter (Signed)
Penny Hines is calling stating her heart monitor fell off Saturday and she doesn't know what to do. She states she has not put it back on and was having a reaction to the tape that was causing it to itch, but is not aware that she did anything to cause it to fall off. Please advise.

## 2020-06-23 NOTE — Telephone Encounter (Signed)
Called patient. She reports she wore the monitor for 3 days and then it fell off. She also had a little bit of a reaction to it and it caused itching. She said the reaction has gotten better, if that were to worsen I did inform her to seek emergency care.    She wants to know if she can go ahead and send monitor back and we can use the date we have. Will check with Dr. Servando Salina.

## 2020-06-24 NOTE — Telephone Encounter (Signed)
Called patient informed her to go ahead and send monitor back. She verbally understood. No further questions.

## 2020-06-24 NOTE — Telephone Encounter (Signed)
That is fine too is have her mail the monitor. No need to send in an additional

## 2020-06-25 ENCOUNTER — Telehealth: Payer: Self-pay | Admitting: *Deleted

## 2020-06-25 NOTE — Telephone Encounter (Signed)
Patient given detailed instructions per Myocardial Perfusion Study Information Sheet for the test on 07/02/2020 at 0815. Patient notified to arrive 15 minutes early and that it is imperative to arrive on time for appointment to keep from having the test rescheduled.  If you need to cancel or reschedule your appointment, please call the office within 24 hours of your appointment. . Patient verbalized understanding. Amarissa Koerner, Adelene Idler No mychart available.

## 2020-07-02 ENCOUNTER — Other Ambulatory Visit: Payer: Self-pay

## 2020-07-02 ENCOUNTER — Ambulatory Visit (INDEPENDENT_AMBULATORY_CARE_PROVIDER_SITE_OTHER): Payer: Medicare PPO

## 2020-07-02 DIAGNOSIS — R079 Chest pain, unspecified: Secondary | ICD-10-CM | POA: Diagnosis not present

## 2020-07-02 DIAGNOSIS — I480 Paroxysmal atrial fibrillation: Secondary | ICD-10-CM

## 2020-07-02 DIAGNOSIS — Z7901 Long term (current) use of anticoagulants: Secondary | ICD-10-CM | POA: Diagnosis not present

## 2020-07-02 DIAGNOSIS — R42 Dizziness and giddiness: Secondary | ICD-10-CM | POA: Diagnosis not present

## 2020-07-02 DIAGNOSIS — I10 Essential (primary) hypertension: Secondary | ICD-10-CM | POA: Diagnosis not present

## 2020-07-02 LAB — MYOCARDIAL PERFUSION IMAGING
LV dias vol: 67 mL (ref 46–106)
LV sys vol: 22 mL
Peak HR: 77 {beats}/min
Rest HR: 61 {beats}/min
SDS: 2
SRS: 2
SSS: 4
TID: 1.09

## 2020-07-02 MED ORDER — TECHNETIUM TC 99M TETROFOSMIN IV KIT
32.4000 | PACK | Freq: Once | INTRAVENOUS | Status: AC | PRN
  Administered 2020-07-02: 32.4 via INTRAVENOUS

## 2020-07-02 MED ORDER — TECHNETIUM TC 99M TETROFOSMIN IV KIT
10.7000 | PACK | Freq: Once | INTRAVENOUS | Status: AC | PRN
  Administered 2020-07-02: 10.7 via INTRAVENOUS

## 2020-07-02 MED ORDER — REGADENOSON 0.4 MG/5ML IV SOLN
0.4000 mg | Freq: Once | INTRAVENOUS | Status: AC
  Administered 2020-07-02: 0.4 mg via INTRAVENOUS

## 2020-07-04 DIAGNOSIS — I480 Paroxysmal atrial fibrillation: Secondary | ICD-10-CM | POA: Diagnosis not present

## 2020-07-09 ENCOUNTER — Telehealth: Payer: Self-pay

## 2020-07-09 NOTE — Telephone Encounter (Signed)
Tried calling patient. No answer and no voicemail set up for me to leave a message. 

## 2020-07-09 NOTE — Telephone Encounter (Signed)
-----   Message from Thomasene Ripple, DO sent at 07/09/2020 11:50 AM EDT ----- Her monitor did show evidence that you do have some fast heartbeat that comes from the top of the heart.  It only happens occasionally.We can discuss more detail at your next visit.

## 2020-07-10 ENCOUNTER — Telehealth: Payer: Self-pay

## 2020-07-10 NOTE — Telephone Encounter (Signed)
-----   Message from Kardie Tobb, DO sent at 07/09/2020 11:50 AM EDT ----- Her monitor did show evidence that you do have some fast heartbeat that comes from the top of the heart.  It only happens occasionally.We can discuss more detail at your next visit. 

## 2020-07-10 NOTE — Telephone Encounter (Signed)
Spoke with patient regarding results and recommendation.  Patient verbalizes understanding and is agreeable to plan of care. Advised patient to call back with any issues or concerns.  

## 2020-07-23 DIAGNOSIS — Z1152 Encounter for screening for COVID-19: Secondary | ICD-10-CM | POA: Diagnosis not present

## 2020-07-23 DIAGNOSIS — R5383 Other fatigue: Secondary | ICD-10-CM | POA: Diagnosis not present

## 2020-08-12 DIAGNOSIS — Z6835 Body mass index (BMI) 35.0-35.9, adult: Secondary | ICD-10-CM | POA: Diagnosis not present

## 2020-08-12 DIAGNOSIS — R61 Generalized hyperhidrosis: Secondary | ICD-10-CM | POA: Diagnosis not present

## 2020-08-12 DIAGNOSIS — R7303 Prediabetes: Secondary | ICD-10-CM | POA: Diagnosis not present

## 2020-08-12 DIAGNOSIS — R5383 Other fatigue: Secondary | ICD-10-CM | POA: Diagnosis not present

## 2020-08-12 DIAGNOSIS — E7800 Pure hypercholesterolemia, unspecified: Secondary | ICD-10-CM | POA: Diagnosis not present

## 2020-08-12 DIAGNOSIS — Z0001 Encounter for general adult medical examination with abnormal findings: Secondary | ICD-10-CM | POA: Diagnosis not present

## 2020-08-12 DIAGNOSIS — Z1339 Encounter for screening examination for other mental health and behavioral disorders: Secondary | ICD-10-CM | POA: Diagnosis not present

## 2020-08-12 DIAGNOSIS — E2839 Other primary ovarian failure: Secondary | ICD-10-CM | POA: Diagnosis not present

## 2020-08-12 DIAGNOSIS — Z1159 Encounter for screening for other viral diseases: Secondary | ICD-10-CM | POA: Diagnosis not present

## 2020-08-12 DIAGNOSIS — Z1231 Encounter for screening mammogram for malignant neoplasm of breast: Secondary | ICD-10-CM | POA: Diagnosis not present

## 2020-09-23 ENCOUNTER — Ambulatory Visit: Payer: Medicare PPO | Admitting: Cardiology

## 2020-11-04 ENCOUNTER — Ambulatory Visit: Payer: Medicare PPO | Admitting: Cardiology

## 2020-11-28 DIAGNOSIS — M0579 Rheumatoid arthritis with rheumatoid factor of multiple sites without organ or systems involvement: Secondary | ICD-10-CM | POA: Diagnosis not present

## 2020-11-28 DIAGNOSIS — R5383 Other fatigue: Secondary | ICD-10-CM | POA: Diagnosis not present

## 2020-11-28 DIAGNOSIS — K219 Gastro-esophageal reflux disease without esophagitis: Secondary | ICD-10-CM | POA: Diagnosis not present

## 2020-11-28 DIAGNOSIS — L659 Nonscarring hair loss, unspecified: Secondary | ICD-10-CM | POA: Diagnosis not present

## 2020-11-28 DIAGNOSIS — D696 Thrombocytopenia, unspecified: Secondary | ICD-10-CM | POA: Diagnosis not present

## 2020-11-28 DIAGNOSIS — G8929 Other chronic pain: Secondary | ICD-10-CM | POA: Diagnosis not present

## 2020-11-28 DIAGNOSIS — E7800 Pure hypercholesterolemia, unspecified: Secondary | ICD-10-CM | POA: Diagnosis not present

## 2020-11-28 DIAGNOSIS — Z79899 Other long term (current) drug therapy: Secondary | ICD-10-CM | POA: Diagnosis not present

## 2020-11-28 DIAGNOSIS — I4891 Unspecified atrial fibrillation: Secondary | ICD-10-CM | POA: Diagnosis not present

## 2020-11-28 DIAGNOSIS — F330 Major depressive disorder, recurrent, mild: Secondary | ICD-10-CM | POA: Diagnosis not present

## 2021-02-02 DIAGNOSIS — L74519 Primary focal hyperhidrosis, unspecified: Secondary | ICD-10-CM | POA: Diagnosis not present

## 2021-02-02 DIAGNOSIS — R110 Nausea: Secondary | ICD-10-CM | POA: Diagnosis not present

## 2021-02-02 DIAGNOSIS — M069 Rheumatoid arthritis, unspecified: Secondary | ICD-10-CM | POA: Diagnosis not present

## 2021-02-02 DIAGNOSIS — F331 Major depressive disorder, recurrent, moderate: Secondary | ICD-10-CM | POA: Diagnosis not present

## 2021-02-02 DIAGNOSIS — Z6832 Body mass index (BMI) 32.0-32.9, adult: Secondary | ICD-10-CM | POA: Diagnosis not present

## 2021-02-02 DIAGNOSIS — E669 Obesity, unspecified: Secondary | ICD-10-CM | POA: Diagnosis not present

## 2021-02-02 DIAGNOSIS — I480 Paroxysmal atrial fibrillation: Secondary | ICD-10-CM | POA: Diagnosis not present

## 2021-02-09 ENCOUNTER — Telehealth: Payer: Self-pay | Admitting: Cardiology

## 2021-02-09 NOTE — Telephone Encounter (Signed)
Please set her up to see me soon

## 2021-02-09 NOTE — Telephone Encounter (Signed)
Patient c/o Palpitations:  High priority if patient c/o lightheadedness, shortness of breath, or chest pain  1) How long have you had palpitations/irregular HR/ Afib? Are you having the symptoms now? Yes, 10 days  2) Are you currently experiencing lightheadedness, SOB or CP? Has a little more SOB, but not audibly SOB on the phone  3) Do you have a history of afib (atrial fibrillation) or irregular heart rhythm? afib  4) Have you checked your BP or HR? (document readings if available): today 114, usually HR is 130-150's when in afib, BP was checked last Monday and was good  5) Are you experiencing any other symptoms? Feels woozy, and "just feels bad all over"  Patient states she has been in afib for 10 days and is still having it now. She states she "just feels bad all over". She states her HR has not been high today, but usually when she is in afib it will be in 130-150's. She states her PCP last Monday heard her afib and states her BP that day looked good. She states she is not sure what she is supposed to do when she is in afib. She states she will sometimes go out of it for long periods of time. She states she realizes she has to live with it, but is not sure if there are things she needs to be doing. She states she also slipped and fell the other night and is not sure if that has anything to do with her feeling bad.

## 2021-02-11 ENCOUNTER — Telehealth: Payer: Self-pay

## 2021-02-11 NOTE — Telephone Encounter (Signed)
Spoke with patient, see chart.    

## 2021-02-11 NOTE — Telephone Encounter (Signed)
Spoke with patient about setting up and appoinment to see Dr. Servando Salina, she is placed on the schedule for 02/18/2021 at 3pm. Patient verbalizes understanding. No questions or concerns at this time. She states "Tell Dr. Servando Salina every time I mention her name people just tell me so many good things about her. She is such a good person."

## 2021-02-12 ENCOUNTER — Ambulatory Visit: Payer: Medicare PPO | Admitting: Cardiology

## 2021-02-18 ENCOUNTER — Ambulatory Visit: Payer: Medicare PPO | Admitting: Cardiology

## 2021-02-18 ENCOUNTER — Other Ambulatory Visit: Payer: Self-pay | Admitting: Cardiology

## 2021-02-18 ENCOUNTER — Other Ambulatory Visit: Payer: Self-pay

## 2021-02-18 ENCOUNTER — Encounter: Payer: Self-pay | Admitting: Cardiology

## 2021-02-18 VITALS — BP 126/84 | HR 130 | Ht 63.0 in | Wt 177.6 lb

## 2021-02-18 DIAGNOSIS — I480 Paroxysmal atrial fibrillation: Secondary | ICD-10-CM | POA: Diagnosis not present

## 2021-02-18 DIAGNOSIS — I340 Nonrheumatic mitral (valve) insufficiency: Secondary | ICD-10-CM

## 2021-02-18 DIAGNOSIS — I10 Essential (primary) hypertension: Secondary | ICD-10-CM

## 2021-02-18 MED ORDER — METOPROLOL SUCCINATE ER 50 MG PO TB24: ORAL_TABLET | ORAL | 3 refills | Status: DC

## 2021-02-18 MED ORDER — FLECAINIDE ACETATE 50 MG PO TABS: 50.0000 mg | ORAL_TABLET | Freq: Two times a day (BID) | ORAL | 3 refills | Status: DC

## 2021-02-18 NOTE — Progress Notes (Signed)
Cardiology Office Note:    Date:  02/19/2021   ID:  Penny Hines, DOB 11-Nov-1945, MRN [id number]  PCP:  Alinda Deem, MD  Cardiologist:  Thomasene Ripple, DO  Electrophysiologist:  None   Referring MD: Rolm Gala, NP   I am back in A. fib  History of Present Illness:    Penny Hines is a 75 y.o. female with a hx of paroxysmal atrial fibrillation on Eliquis, hypertension, rheumatoid arthritis, hyperlipidemia and obesity is here today for follow-up visit.  I saw the patient in September 2021 at that time we continued her medical regimen.  We had planned prior to that visit for a cardioversion but when the patient presented she was in sinus rhythm.  In the meantime she reported that she was experiencing chest pain we got a nuclear stress test we did not show any evidence of ischemia.  I placed a monitor on the patient in the interim there is no evidence of atrial fibrillation but atrial tachycardia was present.  The patient called requesting to be seen as she believes she had converted back into atrial fibrillation.  She will vaccines she felt that she has been significantly short of breath and really tired.  Past Medical History:  Diagnosis Date  . Current use of long term anticoagulation 10/10/2017  . Depression   . Esophageal dilatation   . Essential hypertension 10/10/2017  . GERD (gastroesophageal reflux disease)   . Hiatal hernia   . Paroxysmal atrial fibrillation (HCC) 10/10/2017  . Rheumatoid arthritis [facility])     Past Surgical History:  Procedure Laterality Date  . CESAREAN SECTION  1976  . REPLACEMENT TOTAL KNEE Right 2008    Current Medications: Current Meds  Medication Sig  . clonazePAM (KLONOPIN) 0.5 MG tablet 0.5 mg 2 (two) times daily as needed.  . DULoxetine (CYMBALTA) 60 MG capsule Take 60 mg by mouth daily.  Marland Kitchen ELIQUIS 5 MG TABS tablet Take 5 mg by mouth daily.   . flecainide (TAMBOCOR) 50 MG tablet Take 1 tablet (50 mg total) by mouth 2 (two) times daily.   . metoprolol succinate (TOPROL-XL) 50 MG 24 hr tablet Take with or immediately following a meal. Take 50 mg in the morning and take 25 mg at night.  . nitroGLYCERIN (NITROSTAT) 0.4 MG SL tablet Place 1 tablet (0.4 mg total) under the tongue every 5 (five) minutes as needed for chest pain.  Marland Kitchen omeprazole (PRILOSEC) 20 MG capsule Take 20 mg by mouth daily.  . pravastatin (PRAVACHOL) 10 MG tablet Take 10 mg by mouth daily.  . [DISCONTINUED] metoprolol succinate (TOPROL-XL) 50 MG 24 hr tablet Take 50 mg by mouth daily.     Allergies:   Patient has no known allergies.   Social History   Socioeconomic History  . Marital status: Single    Spouse name: Not on file  . Number of children: Not on file  . Years of education: Not on file  . Highest education level: Not on file  Occupational History  . Not on file  Tobacco Use  . Smoking status: Never Smoker  . Smokeless tobacco: Never Used  Substance and Sexual Activity  . Alcohol use: Yes    Comment: Occasional  . Drug use: Not Currently  . Sexual activity: Not on file  Other Topics Concern  . Not on file  Social History Narrative  . Not on file   Social Determinants of Health   Financial Resource Strain: Not on file  Food Insecurity:  Not on file  Transportation Needs: Not on file  Physical Activity: Not on file  Stress: Not on file  Social Connections: Not on file     Family History: The patient's family history includes Diabetes in her brother and sister; Heart disease in her father; Hypertension in her father and mother; Lung cancer in her brother; Rheum arthritis in her sister; Stroke in her maternal grandmother.  ROS:   Review of Systems  Constitution: Negative for decreased appetite, fever and weight gain.  HENT: Negative for congestion, ear discharge, hoarse voice and sore throat.   Eyes: Negative for discharge, redness, vision loss in right eye and visual halos.  Cardiovascular: Negative for chest pain, dyspnea on  exertion, leg swelling, orthopnea and palpitations.  Respiratory: Negative for cough, hemoptysis, shortness of breath and snoring.   Endocrine: Negative for heat intolerance and polyphagia.  Hematologic/Lymphatic: Negative for bleeding problem. Does not bruise/bleed easily.  Skin: Negative for flushing, nail changes, rash and suspicious lesions.  Musculoskeletal: Negative for arthritis, joint pain, muscle cramps, myalgias, neck pain and stiffness.  Gastrointestinal: Negative for abdominal pain, bowel incontinence, diarrhea and excessive appetite.  Genitourinary: Negative for decreased libido, genital sores and incomplete emptying.  Neurological: Negative for brief paralysis, focal weakness, headaches and loss of balance.  Psychiatric/Behavioral: Negative for altered mental status, depression and suicidal ideas.  Allergic/Immunologic: Negative for HIV exposure and persistent infections.    EKGs/Labs/Other Studies Reviewed:    The following studies were reviewed today:   EKG:  The ekg ordered today demonstrates atrial fibrillation with rapid ventricular rate and occasional PVCs.  Heart rate 130 bpm.  Pharmacologic nuclear stress test on July 02, 2020  The left ventricular ejection fraction is hyperdynamic (>65%).  Nuclear stress EF: 67%.  There was no ST segment deviation noted during stress.  No T wave inversion was noted during stress.  The study is normal.  This is a low risk study.   ZIO monitor done on July 07, 2020 The patient wore the monitor for 2 days 16 hours starting June 19, 2020. Indication: Paroxysmal atrial fibrillation  The minimum heart rate was 57 bpm, maximum heart rate was 210 bpm, and average heart rate was 74 bpm. Predominant underlying rhythm was Sinus Rhythm with multiple episodes of significant wandering baseline.  5 supraventricular Tachycardia runs occurred, the run with the fastest interval lasting 16 beats with a maximum rate of 210  bpm average 182 bpm); the run with the fastest interval was also the longest.  Premature atrial complexes were rare less than 1%.   Premature Ventricular complexes were rare less than 1%.  No ventricular tachycardia, no pauses, No AV block and no atrial fibrillation present. No patient triggered events or diary events were noted.  Transthoracic echocardiogram performed at [facility] on May 24, 2019 showed evidence of left atrial systolic function which is low normal. EF 50 to 55%.The left atrium was mildly dilated. The right atrium was mildly enlarged. There is moderate amount regurgitation present.   Recent Labs: 06/19/2020: BUN 14; Creatinine, Ser 0.90; Hemoglobin 12.5; Magnesium 2.1; Platelets 301; Potassium 4.1; Sodium 140  Recent Lipid Panel No results found for: CHOL, TRIG, HDL, CHOLHDL, VLDL, LDLCALC, LDLDIRECT  Physical Exam:    VS:  BP 126/84   Pulse (!) 130   Ht 5\' 3"  (1.6 m)   Wt 177 lb 9.6 oz (80.6 kg)   SpO2 98%   BMI 31.46 kg/m     Wt Readings from Last 3 Encounters:  02/18/21  177 lb 9.6 oz (80.6 kg)  07/02/20 211 lb (95.7 kg)  06/19/20 211 lb 3.2 oz (95.8 kg)     GEN: Well nourished, well developed in no acute distress HEENT: Normal NECK: No JVD; No carotid bruits LYMPHATICS: No lymphadenopathy CARDIAC: S1S2 noted,RRR, no murmurs, rubs, gallops RESPIRATORY:  Clear to auscultation without rales, wheezing or rhonchi  ABDOMEN: Soft, non-tender, non-distended, +bowel sounds, no guarding. EXTREMITIES: No edema, No cyanosis, no clubbing MUSCULOSKELETAL:  No deformity  SKIN: Warm and dry NEUROLOGIC:  Alert and oriented x 3, non-focal PSYCHIATRIC:  Normal affect, good insight  ASSESSMENT:    1. PAF (paroxysmal atrial fibrillation) (HCC)   2. Hypertension, unspecified type   3. Moderate mitral regurgitation    PLAN:     She is in atrial fibrillation today.  I am going to start the patient flecainide 50 mg twice a day.  I will also increase  her beta-blocker.  I am hoping that once we can control her rate we will be able to convert her back into sinus rhythm.  In the meantime I am going to have her see my EP colleagues for possible consideration for ablation given that she still symptomatic while in A. fib.  She does not have any evidence of right-sided heart failure she had echocardiogram in 2020 which showed moderate mitral regurgitation.  If she converts and still continue to have shortness of breath I am going to repeat an echocardiogram to assess her mitral regurgitation.  Blood pressure is acceptable, continue with current antihypertensive regimen.  She will come early next week for a repeat EKG.   The patient is in agreement with the above plan. The patient left the office in stable condition.  The patient will follow up in 2 weeks.   Medication Adjustments/Labs and Tests Ordered: Current medicines are reviewed at length with the patient today.  Concerns regarding medicines are outlined above.  Orders Placed This Encounter  Procedures  . Basic metabolic panel  . Magnesium  . Ambulatory referral to Cardiac Electrophysiology  . EKG 12-Lead   Meds ordered this encounter  Medications  . metoprolol succinate (TOPROL-XL) 50 MG 24 hr tablet    Sig: Take with or immediately following a meal. Take 50 mg in the morning and take 25 mg at night.    Dispense:  90 tablet    Refill:  3  . flecainide (TAMBOCOR) 50 MG tablet    Sig: Take 1 tablet (50 mg total) by mouth 2 (two) times daily.    Dispense:  180 tablet    Refill:  3    Patient Instructions  Medication Instructions:  Your physician has recommended you make the following change in your medication:  START: Flecainide 50 mg twice daily START: Toprol-XL 50 mg in the morning and 25 mg at night *If you need a refill on your cardiac medications before your next appointment, please call your pharmacy*   Lab Work: Your physician recommends that you return for lab  work: TODAY: BMET, Mag If you have labs (blood work) drawn today and your tests are completely normal, you will receive your results only by: Marland Kitchen MyChart Message (if you have MyChart) OR . A paper copy in the mail If you have any lab test that is abnormal or we need to change your treatment, we will call you to review the results.   Testing/Procedures: None   Follow-Up: At [facility], you and your health needs are our priority.  As part of our  continuing mission to provide you with exceptional heart care, we have created designated Provider Care Teams.  These Care Teams include your primary Cardiologist (physician) and Advanced Practice Providers (APPs -  Physician Assistants and Nurse Practitioners) who all work together to provide you with the care you need, when you need it.  We recommend signing up for the patient portal called "MyChart".  Sign up information is provided on this After Visit Summary.  MyChart is used to connect with patients for Virtual Visits (Telemedicine).  Patients are able to view lab/test results, encounter notes, upcoming appointments, etc.  Non-urgent messages can be sent to your provider as well.   To learn more about what you can do with MyChart, go to [email or web address].    Your next appointment:   2 week(s)  The format for your next appointment:   In Person  Provider:   Thomasene Ripple, DO   Other Instructions  In 1 week come for a nurse visit for EKG     Adopting a Healthy Lifestyle.  Know what a healthy weight is for you (roughly BMI <25) and aim to maintain this   Aim for 7+ servings of fruits and vegetables daily   65-80+ fluid ounces of water or unsweet tea for healthy kidneys   Limit to max 1 drink of alcohol per day; avoid smoking/tobacco   Limit animal fats in diet for cholesterol and heart health - choose grass fed whenever available   Avoid highly processed foods, and foods high in saturated/trans fats   Aim for low stress -  take time to unwind and care for your mental health   Aim for 150 min of moderate intensity exercise weekly for heart health, and weights twice weekly for bone health   Aim for 7-9 hours of sleep daily   When it comes to diets, agreement about the perfect plan isnt easy to find, even among the experts. Experts at the [facility] of [employer] developed an idea known as the Healthy Eating Plate. Just imagine a plate divided into logical, healthy portions.   The emphasis is on diet quality:   Load up on vegetables and fruits - one-half of your plate: Aim for color and variety, and remember that potatoes dont count.   Go for whole grains - one-quarter of your plate: Whole wheat, barley, wheat berries, quinoa, oats, brown rice, and foods made with them. If you want pasta, go with whole wheat pasta.   Protein power - one-quarter of your plate: Fish, chicken, beans, and nuts are all healthy, versatile protein sources. Limit red meat.   The diet, however, does go beyond the plate, offering a few other suggestions.   Use healthy plant oils, such as olive, canola, soy, corn, sunflower and peanut. Check the labels, and avoid partially hydrogenated oil, which have unhealthy trans fats.   If youre thirsty, drink water. Coffee and tea are good in moderation, but skip sugary drinks and limit milk and dairy products to one or two daily servings.   The type of carbohydrate in the diet is more important than the amount. Some sources of carbohydrates, such as vegetables, fruits, whole grains, and beans-are healthier than others.   Finally, stay active  Signed, Thomasene Ripple, DO  02/19/2021 2:23 PM    [facility]

## 2021-02-18 NOTE — Patient Instructions (Addendum)
Medication Instructions:  Your physician has recommended you make the following change in your medication:  START: Flecainide 50 mg twice daily START: Toprol-XL 50 mg in the morning and 25 mg at night *If you need a refill on your cardiac medications before your next appointment, please call your pharmacy*   Lab Work: Your physician recommends that you return for lab work: TODAY: BMET, Mag If you have labs (blood work) drawn today and your tests are completely normal, you will receive your results only by: Marland Kitchen MyChart Message (if you have MyChart) OR . A paper copy in the mail If you have any lab test that is abnormal or we need to change your treatment, we will call you to review the results.   Testing/Procedures: None   Follow-Up: At [facility], you and your health needs are our priority.  As part of our continuing mission to provide you with exceptional heart care, we have created designated Provider Care Teams.  These Care Teams include your primary Cardiologist (physician) and Advanced Practice Providers (APPs -  Physician Assistants and Nurse Practitioners) who all work together to provide you with the care you need, when you need it.  We recommend signing up for the patient portal called "MyChart".  Sign up information is provided on this After Visit Summary.  MyChart is used to connect with patients for Virtual Visits (Telemedicine).  Patients are able to view lab/test results, encounter notes, upcoming appointments, etc.  Non-urgent messages can be sent to your provider as well.   To learn more about what you can do with MyChart, go to [email or web address].    Your next appointment:   2 week(s)  The format for your next appointment:   In Person  Provider:   Thomasene Ripple, DO   Other Instructions  In 1 week come for a nurse visit for EKG

## 2021-02-19 LAB — BASIC METABOLIC PANEL
BUN/Creatinine Ratio: 14 (ref 12–28)
BUN: 13 mg/dL (ref 8–27)
CO2: 24 mmol/L (ref 20–29)
Calcium: 9.4 mg/dL (ref 8.7–10.3)
Chloride: 105 mmol/L (ref 96–106)
Creatinine, Ser: 0.92 mg/dL (ref 0.57–1.00)
Glucose: 100 mg/dL — ABNORMAL HIGH (ref 65–99)
Potassium: 3.8 mmol/L (ref 3.5–5.2)
Sodium: 143 mmol/L (ref 134–144)
eGFR: 65 mL/min/{1.73_m2} (ref >59)

## 2021-02-19 LAB — MAGNESIUM: Magnesium: 2.1 mg/dL (ref 1.6–2.3)

## 2021-02-25 ENCOUNTER — Other Ambulatory Visit: Payer: Self-pay

## 2021-02-25 ENCOUNTER — Ambulatory Visit: Payer: Medicare PPO

## 2021-02-25 MED ORDER — ELIQUIS 5 MG PO TABS: 5.0000 mg | ORAL_TABLET | Freq: Two times a day (BID) | ORAL | 12 refills | Status: AC

## 2021-02-25 MED ORDER — FLECAINIDE ACETATE 50 MG PO TABS: 50.0000 mg | ORAL_TABLET | Freq: Two times a day (BID) | ORAL | 3 refills | Status: DC

## 2021-02-25 NOTE — Progress Notes (Signed)
1.) Reason for visit: EKG  2.) Name of MD requesting visit: Tobb  3.) H&P: Pt has a fib and was started on Flecainide.  4.) ROS related to problem: a fib  5.) Assessment and plan per MD: Tobb  Pt has not started the flecainide. Pt is also taking her Eliquis once daily. EKG not done and pt educated on the importance of Eliquis 5 mg twice daily and to start the Flecainide 50 mg twice daily. Pt verbalized understanding and had no additional questions.

## 2021-02-25 NOTE — Patient Instructions (Signed)
Medication Instructions:  Your physician has recommended you make the following change in your medication:   Take Eliquis 5 mg twice daily. Start Flecainide 50 mg twice daily  *If you need a refill on your cardiac medications before your next appointment, please call your pharmacy*   Lab Work: None ordered If you have labs (blood work) drawn today and your tests are completely normal, you will receive your results only by: Marland Kitchen MyChart Message (if you have MyChart) OR . A paper copy in the mail If you have any lab test that is abnormal or we need to change your treatment, we will call you to review the results.   Testing/Procedures: None ordered   Follow-Up: At [facility], you and your health needs are our priority.  As part of our continuing mission to provide you with exceptional heart care, we have created designated Provider Care Teams.  These Care Teams include your primary Cardiologist (physician) and Advanced Practice Providers (APPs -  Physician Assistants and Nurse Practitioners) who all work together to provide you with the care you need, when you need it.  We recommend signing up for the patient portal called "MyChart".  Sign up information is provided on this After Visit Summary.  MyChart is used to connect with patients for Virtual Visits (Telemedicine).  Patients are able to view lab/test results, encounter notes, upcoming appointments, etc.  Non-urgent messages can be sent to your provider as well.   To learn more about what you can do with MyChart, go to [email or web address].    Your next appointment:   As scheduled    The format for your next appointment:   In Person  Provider:   Thomasene Ripple, DO   Other Instructions Flecainide tablets What is this medicine? FLECAINIDE (FLEK a nide) is an antiarrhythmic drug. This medicine is used to prevent irregular heart rhythm. It can also slow down fast heartbeats called tachycardia. This medicine may be used for  other purposes; ask your health care provider or pharmacist if you have questions. COMMON BRAND NAME(S): Tambocor What should I tell my health care provider before I take this medicine? They need to know if you have any of these conditions:  abnormal levels of potassium in the blood  heart disease including heart rhythm and heart rate problems  kidney or liver disease  recent heart attack  an unusual or allergic reaction to flecainide, local anesthetics, other medicines, foods, dyes, or preservatives  pregnant or trying to get pregnant  breast-feeding How should I use this medicine? Take this medicine by mouth with a glass of water. Follow the directions on the prescription label. You can take this medicine with or without food. Take your doses at regular intervals. Do not take your medicine more often than directed. Do not stop taking this medicine suddenly. This may cause serious, heart-related side effects. If your doctor wants you to stop the medicine, the dose may be slowly lowered over time to avoid any side effects. Talk to your pediatrician regarding the use of this medicine in children. While this drug may be prescribed for children as young as 1 year of age for selected conditions, precautions do apply. Overdosage: If you think you have taken too much of this medicine contact a poison control center or emergency room at once. NOTE: This medicine is only for you. Do not share this medicine with others. What if I miss a dose? If you miss a dose, take it as soon as you can. If  it is almost time for your next dose, take only that dose. Do not take double or extra doses. What may interact with this medicine? Do not take this medicine with any of the following medications:  amoxapine  arsenic trioxide  certain antibiotics like clarithromycin, erythromycin, gatifloxacin, gemifloxacin, levofloxacin, moxifloxacin, sparfloxacin, or troleandomycin  certain antidepressants called  tricyclic antidepressants like amitriptyline, imipramine, or nortriptyline  certain medicines to control heart rhythm like disopyramide, encainide, moricizine, procainamide, propafenone, and quinidine  cisapride  delavirdine  droperidol  haloperidol  hawthorn  imatinib  levomethadyl  maprotiline  medicines for malaria like chloroquine and halofantrine  pentamidine  phenothiazines like chlorpromazine, mesoridazine, prochlorperazine, thioridazine  pimozide  quinine  ranolazine  ritonavir  sertindole This medicine may also interact with the following medications:  cimetidine  dofetilide  medicines for angina or high blood pressure  medicines to control heart rhythm like amiodarone and digoxin  ziprasidone This list may not describe all possible interactions. Give your health care provider a list of all the medicines, herbs, non-prescription drugs, or dietary supplements you use. Also tell them if you smoke, drink alcohol, or use illegal drugs. Some items may interact with your medicine. What should I watch for while using this medicine? Visit your doctor or health care professional for regular checks on your progress. Because your condition and the use of this medicine carries some risk, it is a good idea to carry an identification card, necklace or bracelet with details of your condition, medications and doctor or health care professional. Check your blood pressure and pulse rate regularly. Ask your health care professional what your blood pressure and pulse rate should be, and when you should contact him or her. Your doctor or health care professional also may schedule regular blood tests and electrocardiograms to check your progress. You may get drowsy or dizzy. Do not drive, use machinery, or do anything that needs mental alertness until you know how this medicine affects you. Do not stand or sit up quickly, especially if you are an older patient. This reduces the  risk of dizzy or fainting spells. Alcohol can make you more dizzy, increase flushing and rapid heartbeats. Avoid alcoholic drinks. What side effects may I notice from receiving this medicine? Side effects that you should report to your doctor or health care professional as soon as possible:  chest pain, continued irregular heartbeats  difficulty breathing  swelling of the legs or feet  trembling, shaking  unusually weak or tired Side effects that usually do not require medical attention (report to your doctor or health care professional if they continue or are bothersome):  blurred vision  constipation  headache  nausea, vomiting  stomach pain This list may not describe all possible side effects. Call your doctor for medical advice about side effects. You may report side effects to FDA at 1-800-FDA-1088. Where should I keep my medicine? Keep out of the reach of children. Store at room temperature between 15 and 30 degrees C (59 and 86 degrees F). Protect from light. Keep container tightly closed. Throw away any unused medicine after the expiration date. NOTE: This sheet is a summary. It may not cover all possible information. If you have questions about this medicine, talk to your doctor, pharmacist, or health care provider.  2021 [employer] Standard (2018-09-18 11:41:38)

## 2021-03-05 ENCOUNTER — Telehealth: Payer: Self-pay

## 2021-03-05 NOTE — Telephone Encounter (Signed)
Tried calling patient. No answer, line continued to ring unable to leave a message.    

## 2021-03-10 ENCOUNTER — Ambulatory Visit (INDEPENDENT_AMBULATORY_CARE_PROVIDER_SITE_OTHER): Payer: Medicare PPO | Admitting: Cardiology

## 2021-03-10 ENCOUNTER — Encounter: Payer: Self-pay | Admitting: Cardiology

## 2021-03-10 ENCOUNTER — Other Ambulatory Visit: Payer: Self-pay

## 2021-03-10 VITALS — BP 118/86 | HR 97 | Ht 63.0 in | Wt 176.2 lb

## 2021-03-10 DIAGNOSIS — I4819 Other persistent atrial fibrillation: Secondary | ICD-10-CM

## 2021-03-10 DIAGNOSIS — Z01818 Encounter for other preprocedural examination: Secondary | ICD-10-CM

## 2021-03-10 DIAGNOSIS — Z01812 Encounter for preprocedural laboratory examination: Secondary | ICD-10-CM

## 2021-03-10 NOTE — Patient Instructions (Signed)
Medication Instructions:  Your physician recommends that you continue on your current medications as directed. Please refer to the Current Medication list given to you today.  *If you need a refill on your cardiac medications before your next appointment, please call your pharmacy*   Lab Work: None ordered today, see procedure instructions If you have labs (blood work) drawn today and your tests are completely normal, you will receive your results only by: Marland Kitchen MyChart Message (if you have MyChart) OR . A paper copy in the mail If you have any lab test that is abnormal or we need to change your treatment, we will call you to review the results.   Testing/Procedures: Your physician has requested that you have cardiac CT within 7 days PRIOR to your ablation. Cardiac computed tomography (CT) is a painless test that uses an x-ray machine to take clear, detailed pictures of your heart.  Please follow instruction below located under "other instructions". You will get a call from our office to schedule the date for this test.  Your physician has recommended that you have an ablation. Catheter ablation is a medical procedure used to treat some cardiac arrhythmias (irregular heartbeats). During catheter ablation, a long, thin, flexible tube is put into a blood vessel in your groin (upper thigh), or neck. This tube is called an ablation catheter. It is then guided to your heart through the blood vessel. Radio frequency waves destroy small areas of heart tissue where abnormal heartbeats may cause an arrhythmia to start. Please follow instruction below located under "other instructions".   Follow-Up: At [facility], you and your health needs are our priority.  As part of our continuing mission to provide you with exceptional heart care, we have created designated Provider Care Teams.  These Care Teams include your primary Cardiologist (physician) and Advanced Practice Providers (APPs -  Physician  Assistants and Nurse Practitioners) who all work together to provide you with the care you need, when you need it.  Your next appointment:   1 month(s) after your ablation  The format for your next appointment:   In Person  Provider:   AFib clinic   Thank you for choosing [facility]!!   Dory Horn, RN [phone]    Other Instructions    Your cardiac CT will be scheduled at:  [facility] [address] [place], [place] [address] [phone]  Please arrive at the [facility] main entrance (entrance A) of [facility] 30 minutes prior to test start time. Proceed to the [facility] Radiology Department (first floor) to check-in and test prep.  Please follow these instructions carefully (unless otherwise directed):  On the Night Before the Test: . Be sure to Drink plenty of water. . Do not consume any caffeinated/decaffeinated beverages or chocolate 12 hours prior to your test. . Do not take any antihistamines 12 hours prior to your test.  On the Day of the Test: . Drink plenty of water until 1 hour prior to the test. . Do not eat any food 4 hours prior to the test. . You may take your regular medications prior to the test.  . Take an extra Metoprolol (Toprol) 25 mg the morning of this testing, two hours prior.  If your heart rate is less than 70 you do NOT need to take the extra Toprol. Marland Kitchen HOLD Furosemide/Hydrochlorothiazide morning of the test.        After the Test: . Drink plenty of water. . After receiving  IV contrast, you may experience a mild flushed feeling. This is normal. . On occasion, you may experience a mild rash up to 24 hours after the test. This is not dangerous. If this occurs, you can take Benadryl 25 mg and increase your fluid intake. . If you experience trouble breathing, this can be serious. If it is severe call 911 IMMEDIATELY. If it is mild, please call our office. . If you take any of these medications:  Glipizide/Metformin, Avandament, Glucavance, please do not take 48 hours after completing test unless otherwise instructed.   Once we have confirmed authorization from your insurance company, we will call you to set up a date and time for your test. Based on how quickly your insurance processes prior authorizations requests, please allow up to 4 weeks to be contacted for scheduling your Cardiac CT appointment. Be advised that routine Cardiac CT appointments could be scheduled as many as 8 weeks after your provider has ordered it.  For non-scheduling related questions, please contact the cardiac imaging nurse navigator should you have any questions/concerns: Rockwell Alexandria, Cardiac Imaging Nurse Navigator Larey Brick, Cardiac Imaging Nurse Navigator [facility] Direct Office Dial: [phone]   For scheduling needs, including cancellations and rescheduling, please call [place], [phone].     Electrophysiology/Ablation Procedure Instructions   You are scheduled for a(n)  ablation on 04/24/2021 with Dr. Loman Brooklyn.   1.   Pre procedure testing-             A.  LAB WORK --- between 6/20 - 7/01  for your pre procedure blood work.  You do NOT need to be fasting.  You can stop by the [place] office anytime during business hours, avoid lunchtime hours.    2. On the day of your procedure 04/24/2021 you will go to [facility] [phone]) at 5:30 am.  Bonita Quin will go to the main entrance A [employer]) and enter where the [employer] are.  Your driver will drop you off and you will head down the hallway to ADMITTING.  You may have one support person come in to the hospital with you.  They will be asked to wait in the waiting room. It is OK to have someone drop you off and come back when you are ready to be discharged.   3.   Do not eat or drink after midnight prior to your procedure.   4.   On the morning of your procedure do NOT take any  medication. Do not miss any doses of your blood thinner prior to the morning of your procedure or your procedure will need to be rescheduled.   5.  Plan for an overnight stay but you may be discharged after your procedure, if you use your phone frequently bring your phone charger. If you are discharged after your procedure you will need someone to drive you home and be with you for 24 hours after your procedure.   6. You will follow up with the AFIB clinic 4 weeks after your procedure.  You will follow up with Dr. Elberta Fortis  3 months after your procedure.  These appointments will be made for you.   * If you have ANY questions please call the office [phone] and ask for Clevester Helzer RN or send me a MyChart message   * Occasionally, EP Studies and ablations can become lengthy.  Please make your family aware of this before your procedure starts.  Average  time ranges from 2-8 hours for EP studies/ablations.  Your physician will call your family after the procedure with the results.                                    AFIB CLINIC INFORMATION: Your appointment is scheduled on: ___________ at _____________. Please arrive 15 minutes early for check-in. The AFib Clinic is located in the Heart and Vascular Specialty Clinics at [facility]. Parking instructions/directions: Government social research officer [address] ([address]). When you pull in to Entrance C, there is an underground parking garage to your right. The code to enter the garage is _______________. Take the elevators to the first floor. Follow the signs to the Heart and Vascular Specialty Clinics. You will see registration at the end of the hallway.  Phone number: [phone]    Cardiac Ablation Cardiac ablation is a procedure to destroy (ablate) some heart tissue that is sending bad signals. These bad signals cause problems in heart rhythm. The heart has many areas that make these signals. If there are problems in these areas, they can make the heart beat in  a way that is not normal. Destroying some tissues can help make the heart rhythm normal. Tell your doctor about:  Any allergies you have.  All medicines you are taking. These include vitamins, herbs, eye drops, creams, and over-the-counter medicines.  Any problems you or family members have had with medicines that make you fall asleep (anesthetics).  Any blood disorders you have.  Any surgeries you have had.  Any medical conditions you have, such as kidney failure.  Whether you are pregnant or may be pregnant. What are the risks? This is a safe procedure. But problems may occur, including:  Infection.  Bruising and bleeding.  Bleeding into the chest.  Stroke or blood clots.  Damage to nearby areas of your body.  Allergies to medicines or dyes.  The need for a pacemaker if the normal system is damaged.  Failure of the procedure to treat the problem. What happens before the procedure? Medicines Ask your doctor about:  Changing or stopping your normal medicines. This is important.  Taking aspirin and ibuprofen. Do not take these medicines unless your doctor tells you to take them.  Taking other medicines, vitamins, herbs, and supplements. General instructions  Follow instructions from your doctor about what you cannot eat or drink.  Plan to have someone take you home from the hospital or clinic.  If you will be going home right after the procedure, plan to have someone with you for 24 hours.  Ask your doctor what steps will be taken to prevent infection. What happens during the procedure?  An IV tube will be put into one of your veins.  You will be given a medicine to help you relax.  The skin on your neck or groin will be numbed.  A cut (incision) will be made in your neck or groin. A needle will be put through your cut and into a large vein.  A tube (catheter) will be put into the needle. The tube will be moved to your heart.  Dye may be put through the  tube. This helps your doctor see your heart.  Small devices (electrodes) on the tube will send out signals.  A type of energy will be used to destroy some heart tissue.  The tube will be taken out.  Pressure will be held  on your cut. This helps stop bleeding.  A bandage will be put over your cut. The exact procedure may vary among doctors and hospitals.   What happens after the procedure?  You will be watched until you leave the hospital or clinic. This includes checking your heart rate, breathing rate, oxygen, and blood pressure.  Your cut will be watched for bleeding. You will need to lie still for a few hours.  Do not drive for 24 hours or as long as your doctor tells you. Summary  Cardiac ablation is a procedure to destroy some heart tissue. This is done to treat heart rhythm problems.  Tell your doctor about any medical conditions you may have. Tell him or her about all medicines you are taking to treat them.  This is a safe procedure. But problems may occur. These include infection, bruising, bleeding, and damage to nearby areas of your body.  Follow what your doctor tells you about food and drink. You may also be told to change or stop some of your medicines.  After the procedure, do not drive for 24 hours or as long as your doctor tells you. This information is not intended to replace advice given to you by your health care provider. Make sure you discuss any questions you have with your health care provider. Document Revised: 08/30/2019 Document Reviewed: 08/30/2019 Elsevier Patient Education  2021 [employer].

## 2021-03-10 NOTE — Progress Notes (Signed)
Electrophysiology Office Note   Date:  03/10/2021   ID:  Penny Hines, DOB 1946/05/19, MRN [id number]  PCP:  Alinda Deem, MD  Cardiologist:  Tobb Primary Electrophysiologist:  Penny Hearty [name], MD    Chief Complaint: AF   History of Present Illness: Penny Hines is a 75 y.o. female who is being seen today for the evaluation of AF at the request of Tobb, Kardie, DO. Presenting today for electrophysiology evaluation.  She has a history significant for hypertension, rheumatoid arthritis, hyperlipidemia, obesity and paroxysmal atrial fibrillation.  She feels like she went into atrial fibrillation approximately a month and a half ago.  Since that time, she has had much more fatigue, lethargy, and shortness of breath.  She is not able to do all of the activities that she would prefer.  She is able to take care of herself but is unable to exert herself due to her lethargy and shortness of breath.  She would like to get back into rhythm.  Today, she denies symptoms of palpitations, chest pain, orthopnea, PND, lower extremity edema, claudication, dizziness, presyncope, syncope, bleeding, or neurologic sequela. The patient is tolerating medications without difficulties.    Past Medical History:  Diagnosis Date  . Current use of long term anticoagulation 10/10/2017  . Depression   . Esophageal dilatation   . Essential hypertension 10/10/2017  . GERD (gastroesophageal reflux disease)   . Hiatal hernia   . Paroxysmal atrial fibrillation (HCC) 10/10/2017  . Rheumatoid arthritis [facility])    Past Surgical History:  Procedure Laterality Date  . CESAREAN SECTION  1976  . REPLACEMENT TOTAL KNEE Right 2008     Current Outpatient Medications  Medication Sig Dispense Refill  . clonazePAM (KLONOPIN) 0.5 MG tablet 0.5 mg 2 (two) times daily as needed.    Marland Kitchen ELIQUIS 5 MG TABS tablet Take 1 tablet (5 mg total) by mouth 2 (two) times daily. 60 tablet 12  . flecainide (TAMBOCOR) 50 MG tablet  Take 1 tablet (50 mg total) by mouth 2 (two) times daily. 60 tablet 3  . metoprolol succinate (TOPROL-XL) 50 MG 24 hr tablet Take with or immediately following a meal. Take 50 mg in the morning and take 25 mg at night. 90 tablet 3  . nitroGLYCERIN (NITROSTAT) 0.4 MG SL tablet Place 1 tablet (0.4 mg total) under the tongue every 5 (five) minutes as needed for chest pain. 90 tablet 3  . omeprazole (PRILOSEC) 20 MG capsule Take 20 mg by mouth daily.    . pravastatin (PRAVACHOL) 10 MG tablet Take 10 mg by mouth daily.     No current facility-administered medications for this visit.    Allergies:   Patient has no known allergies.   Social History:  The patient  reports that she has never smoked. She has never used smokeless tobacco. She reports current alcohol use. She reports previous drug use.   Family History:  The patient's family history includes Diabetes in her brother and sister; Heart disease in her father; Hypertension in her father and mother; Lung cancer in her brother; Rheum arthritis in her sister; Stroke in her maternal grandmother.    ROS:  Please see the history of present illness.   Otherwise, review of systems is positive for none.   All other systems are reviewed and negative.    PHYSICAL EXAM: VS:  BP 118/86   Pulse 97   Ht 5\' 3"  (1.6 m)   Wt 176 lb 3.2 oz (79.9 kg)  SpO2 93%   BMI 31.21 kg/m  , BMI Body mass index is 31.21 kg/m. GEN: Well nourished, well developed, in no acute distress  HEENT: normal  Neck: no JVD, carotid bruits, or masses Cardiac: RRR; no murmurs, rubs, or gallops,no edema  Respiratory:  clear to auscultation bilaterally, normal work of breathing GI: soft, nontender, nondistended, + BS MS: no deformity or atrophy  Skin: warm and dry Neuro:  Strength and sensation are intact Psych: euthymic mood, full affect  EKG:  EKG is ordered today. Personal review of the ekg ordered shows atrial fibrillation, rate 97  Recent Labs: 06/19/2020: Hemoglobin  12.5; Platelets 301 02/18/2021: BUN 13; Creatinine, Ser 0.92; Magnesium 2.1; Potassium 3.8; Sodium 143    Lipid Panel  No results found for: CHOL, TRIG, HDL, CHOLHDL, VLDL, LDLCALC, LDLDIRECT   Wt Readings from Last 3 Encounters:  03/10/21 176 lb 3.2 oz (79.9 kg)  02/18/21 177 lb 9.6 oz (80.6 kg)  07/02/20 211 lb (95.7 kg)      Other studies Reviewed: Additional studies/ records that were reviewed today include: TTE 05/24/2019 Review of the above records today demonstrates:  Ejection fraction 50 to 55%.  LV wall thickness is normal. Left atrium is mildly dilated by volume  right atrium is mildly dilated moderate mitral regurgitation  Cardiac monitor 07/09/2020 personally reviewed Max 210 bpm 08:50am, 09/10 Min 57 bpm 01:48am, 09/10 Avg 74 bpm Less than 1% ventricular and supraventricular ectopy Predominant rhythm sinus rhythm 5 runs of SVT, fastest and longest 16 beats at 210 bpm  ASSESSMENT AND PLAN:  1.  Persistent atrial fibrillation: Currently on flecainide and Eliquis.  CHA2DS2-VASc of 4.  She has symptoms of weakness and fatigue.  She was put on flecainide but has not yet converted.  She would prefer to be off of antiarrhythmics and would prefer ablation.  Risks and benefits of been discussed.  She understands these risks and is agreed to the procedure.  Risk, benefits, and alternatives to EP study and radiofrequency ablation for afib were also discussed in detail today. These risks include but are not limited to stroke, bleeding, vascular damage, tamponade, perforation, damage to the esophagus, lungs, and other structures, pulmonary vein stenosis, worsening renal function, and death. The patient understands these risk and wishes to proceed.  We Penny Hines therefore proceed with catheter ablation at the next available time.  Carto, ICE, anesthesia are requested for the procedure.  Penny Hines also obtain CT PV protocol prior to the procedure to exclude LAA thrombus and further evaluate atrial  anatomy.   2.  Hypertension: Currently well controlled  Case discussed with primary cardiology  Current medicines are reviewed at length with the patient today.   The patient does not have concerns regarding her medicines.  The following changes were made today:  none  Labs/ tests ordered today include:  Orders Placed This Encounter  Procedures  . CT CARDIAC MORPH/PULM VEIN W/CM&W/O CA SCORE  . Basic metabolic panel  . CBC  . EKG 12-Lead     Disposition:   FU with Penny Hines 3 months  Signed, Penny Hines [name], MD  03/10/2021 1:48 PM     [facility] [address] [place] [address] [phone] (office) [phone] (fax)

## 2021-03-11 ENCOUNTER — Ambulatory Visit: Payer: Medicare PPO | Admitting: Cardiology

## 2021-03-11 ENCOUNTER — Telehealth: Payer: Self-pay | Admitting: Cardiology

## 2021-03-11 NOTE — Telephone Encounter (Signed)
Penny Hines is calling requesting to speak with Penny Hines to reschedule her ablation due to a conflict. Please advise.

## 2021-03-12 NOTE — Telephone Encounter (Signed)
Attempted to return call, no answer, no voicemail.

## 2021-03-12 NOTE — Telephone Encounter (Signed)
Will move AFib ablation from 7/15 to 7/20  Pt aware I will be in touch later to confirm changes/instructions.  Patient verbalized understanding and agreeable to plan.

## 2021-03-12 NOTE — Telephone Encounter (Signed)
    Pt is returning call to r/s ablation

## 2021-03-16 DIAGNOSIS — L74519 Primary focal hyperhidrosis, unspecified: Secondary | ICD-10-CM | POA: Diagnosis not present

## 2021-03-16 DIAGNOSIS — I480 Paroxysmal atrial fibrillation: Secondary | ICD-10-CM | POA: Diagnosis not present

## 2021-03-16 DIAGNOSIS — F331 Major depressive disorder, recurrent, moderate: Secondary | ICD-10-CM | POA: Diagnosis not present

## 2021-04-04 ENCOUNTER — Emergency Department (HOSPITAL_COMMUNITY)
Admission: EM | Admit: 2021-04-04 | Discharge: 2021-04-04 | Disposition: A | Discharge status: Home / Self Care | Payer: Medicare PPO | Attending: Emergency Medicine | Admitting: Emergency Medicine

## 2021-04-04 ENCOUNTER — Encounter (HOSPITAL_COMMUNITY): Payer: Self-pay | Admitting: Emergency Medicine

## 2021-04-04 ENCOUNTER — Emergency Department (HOSPITAL_COMMUNITY): Payer: Medicare PPO

## 2021-04-04 DIAGNOSIS — I10 Essential (primary) hypertension: Secondary | ICD-10-CM | POA: Insufficient documentation

## 2021-04-04 DIAGNOSIS — R072 Precordial pain: Secondary | ICD-10-CM | POA: Diagnosis not present

## 2021-04-04 DIAGNOSIS — I482 Chronic atrial fibrillation, unspecified: Secondary | ICD-10-CM | POA: Insufficient documentation

## 2021-04-04 DIAGNOSIS — R5383 Other fatigue: Secondary | ICD-10-CM | POA: Insufficient documentation

## 2021-04-04 DIAGNOSIS — Z20822 Contact with and (suspected) exposure to covid-19: Secondary | ICD-10-CM | POA: Diagnosis not present

## 2021-04-04 DIAGNOSIS — Z79899 Other long term (current) drug therapy: Secondary | ICD-10-CM | POA: Insufficient documentation

## 2021-04-04 DIAGNOSIS — Z96651 Presence of right artificial knee joint: Secondary | ICD-10-CM | POA: Diagnosis not present

## 2021-04-04 DIAGNOSIS — I4891 Unspecified atrial fibrillation: Secondary | ICD-10-CM | POA: Diagnosis not present

## 2021-04-04 DIAGNOSIS — Z7901 Long term (current) use of anticoagulants: Secondary | ICD-10-CM | POA: Insufficient documentation

## 2021-04-04 DIAGNOSIS — R079 Chest pain, unspecified: Secondary | ICD-10-CM | POA: Diagnosis not present

## 2021-04-04 DIAGNOSIS — R0602 Shortness of breath: Secondary | ICD-10-CM | POA: Diagnosis not present

## 2021-04-04 DIAGNOSIS — R0789 Other chest pain: Secondary | ICD-10-CM | POA: Diagnosis not present

## 2021-04-04 DIAGNOSIS — R42 Dizziness and giddiness: Secondary | ICD-10-CM | POA: Diagnosis not present

## 2021-04-04 LAB — URINALYSIS, ROUTINE W REFLEX MICROSCOPIC
Bilirubin Urine: NEGATIVE
Glucose, UA: NEGATIVE mg/dL
Hgb urine dipstick: NEGATIVE
Ketones, ur: NEGATIVE mg/dL
Leukocytes,Ua: NEGATIVE
Nitrite: NEGATIVE
Protein, ur: NEGATIVE mg/dL
Specific Gravity, Urine: 1.006 (ref 1.005–1.030)
pH: 6 (ref 5.0–8.0)

## 2021-04-04 LAB — CBC WITH DIFFERENTIAL/PLATELET
Abs Immature Granulocytes: 0.02 10*3/uL (ref 0.00–0.07)
Basophils Absolute: 0.1 10*3/uL (ref 0.0–0.1)
Basophils Relative: 1 %
Eosinophils Absolute: 0.1 10*3/uL (ref 0.0–0.5)
Eosinophils Relative: 2 %
HCT: 40.2 % (ref 36.0–46.0)
Hemoglobin: 13 g/dL (ref 12.0–15.0)
Immature Granulocytes: 0 %
Lymphocytes Relative: 26 %
Lymphs Abs: 1.6 10*3/uL (ref 0.7–4.0)
MCH: 30.5 pg (ref 26.0–34.0)
MCHC: 32.3 g/dL (ref 30.0–36.0)
MCV: 94.4 fL (ref 80.0–100.0)
Monocytes Absolute: 0.6 10*3/uL (ref 0.1–1.0)
Monocytes Relative: 10 %
Neutro Abs: 3.8 10*3/uL (ref 1.7–7.7)
Neutrophils Relative %: 61 %
Platelets: 304 10*3/uL (ref 150–400)
RBC: 4.26 MIL/uL (ref 3.87–5.11)
RDW: 14.4 % (ref 11.5–15.5)
WBC: 6.2 10*3/uL (ref 4.0–10.5)
nRBC: 0 % (ref 0.0–0.2)

## 2021-04-04 LAB — RESP PANEL BY RT-PCR (FLU A&B, COVID) ARPGX2
Influenza A by PCR: NEGATIVE
Influenza B by PCR: NEGATIVE
SARS Coronavirus 2 by RT PCR: NEGATIVE

## 2021-04-04 LAB — PROTIME-INR
INR: 1.1 (ref 0.8–1.2)
Prothrombin Time: 14.3 seconds (ref 11.4–15.2)

## 2021-04-04 LAB — TROPONIN I (HIGH SENSITIVITY)
Troponin I (High Sensitivity): 7 ng/L (ref <18)
Troponin I (High Sensitivity): 9 ng/L (ref <18)

## 2021-04-04 LAB — BRAIN NATRIURETIC PEPTIDE: B Natriuretic Peptide: 260.2 pg/mL — ABNORMAL HIGH (ref 0.0–100.0)

## 2021-04-04 MED ORDER — METOPROLOL SUCCINATE ER 25 MG PO TB24
25.0000 mg | ORAL_TABLET | Freq: Once | ORAL | Status: AC
  Administered 2021-04-04: 25 mg via ORAL
  Filled 2021-04-04: qty 1

## 2021-04-04 NOTE — ED Provider Notes (Signed)
Emergency Medicine Provider Triage Evaluation Note  Penny Hines , a 75 y.o. female  was evaluated in triage.  Pt complains of central chest pain which started around 7 AM.  Radiation to left arm.  EMS noted radiation to the back however the patient denies this to me.  History of A. fib with pending ablation July.  No prior history of STEMI/NSTEMI.  Pain is currently 5/10.  She has some associated shortness of breath.  EMS noted she was in A. fib with a rate from 60-1 30.  She is compliant with her medicines including her Eliquis.  Denies any dizziness, syncope  Review of Systems  Positive: As above Negative: As above  Physical Exam  There were no vitals taken for this visit. Gen:   Awake, no distress   Resp:  Normal effort  MSK:   Moves extremities without difficulty  Other:    Medical Decision Making  Medically screening exam initiated at 11:07 AM.  Appropriate orders placed.  ALYIA LACERTE was informed that the remainder of the evaluation will be completed by another provider, this initial triage assessment does not replace that evaluation, and the importance of remaining in the ED until their evaluation is complete.     Leone Brand 04/04/21 1109    Arby Barrette, MD 04/09/21 [phone]

## 2021-04-04 NOTE — ED Triage Notes (Signed)
Pt to triage via [facility] from home.  Reports chest pressure that radiates into back with SOB since 7am.  Hx of A fib.  CBG 97.  20g left hand.  ASA 324mg  given PTA.

## 2021-04-04 NOTE — ED Notes (Signed)
Patient reports chest pain started this morning after she got up. Denies dizziness, SOB, N/V, or diaphoresis. Does report pain radiating into the left arm and describes it as tingling. Patient states she has been feeling sad and "not good" for > 1 week with and had an episode where she felt like she was going to pass out in a store. Skin is warm and dry, color WNL for ethnicity. A Fib on monitor with rate 80- low 100s. Cardiac monitor in place with continuous O2 and intermittent NIBP. Call bell in patient's hand. Labs sent- drawn from IVAD 20g L hand by EMS.

## 2021-04-04 NOTE — ED Provider Notes (Signed)
[facility] EMERGENCY DEPARTMENT Provider Note   CSN: [id number] Arrival date & time: 04/04/21  1100     History Chief Complaint  Patient presents with   Chest Pain    Penny Hines is a 75 y.o. female.  HPI Patient reports that she got diagnosed with atrial fibrillation several months ago.  She reports she is on Eliquis.  She reports her heart rates were quite elevated when she started treatment.  She was consistently having heart rates as high as 130s to 160s.  She reports at that time she was having a lot of fatigue and shortness of breath.  Patient reports she has more recently been started on flecainide and is significantly improved with her heart rate.  She reports now her heart rates ranged down to the 90s to low 100s.  Patient reports for the past couple months that she has been getting episodes of extreme fatigue and nausea.  She reports she does often deal with a lot of nausea at baseline.  Typically if she throws up she feels better.  Patient reports she will go out shopping and get extremely nauseated and weak and sweaty.  She might go into the bathroom and throw up and then feel better for a while.  She has been pushing through these episodes over the past couple of months.  Patient reports also for the past few weeks or more she is getting episodes of chest discomfort.  Sometimes is a pressure or burning in the chest that radiates to the arm.  She reports this morning she awakened and was noticing some pressure and discomfort sensation that radiated to her arm and gave her arm a combination of a known plus achy feeling.  She reports her family has been upset with her for not seeking treatment or getting evaluated when these episodes occur, so this morning she did call EMS for evaluation.    Past Medical History:  Diagnosis Date   Current use of long term anticoagulation 10/10/2017   Depression    Esophageal dilatation    Essential hypertension 10/10/2017    GERD (gastroesophageal reflux disease)    Hiatal hernia    Paroxysmal atrial fibrillation (HCC) 10/10/2017   Rheumatoid arthritis [facility])     Patient Active Problem List   Diagnosis Date Noted   Chest pain of uncertain etiology 06/19/2020   Dizziness 06/19/2020   Obesity (BMI 30-39.9) 06/19/2020   Shortness of breath 08/15/2019   Rheumatoid arthritis (HCC)    Hiatal hernia    GERD (gastroesophageal reflux disease)    Esophageal dilatation    Depression    Current use of long term anticoagulation 10/10/2017   Essential hypertension 10/10/2017   Paroxysmal atrial fibrillation (HCC) 10/10/2017    Past Surgical History:  Procedure Laterality Date   CESAREAN SECTION  1976   REPLACEMENT TOTAL KNEE Right 2008     OB History   No obstetric history on file.     Family History  Problem Relation Age of Onset   Hypertension Mother    Hypertension Father    Heart disease Father    Diabetes Sister    Lung cancer Brother    Diabetes Brother    Stroke Maternal Grandmother    Rheum arthritis Sister     Social History   Tobacco Use   Smoking status: Never   Smokeless tobacco: Never  Substance Use Topics   Alcohol use: Yes    Comment: Occasional   Drug use: Not Currently  Home Medications Prior to Admission medications   Medication Sig Start Date End Date Taking? Authorizing Provider  clonazePAM (KLONOPIN) 0.5 MG tablet Take 0.5 mg by mouth 2 (two) times daily as needed for anxiety. 06/05/19  Yes [provider]  ELIQUIS 5 MG TABS tablet Take 1 tablet (5 mg total) by mouth 2 (two) times daily. 02/25/21  Yes Tobb, Kardie, DO  flecainide (TAMBOCOR) 50 MG tablet Take 1 tablet (50 mg total) by mouth 2 (two) times daily. 02/25/21  Yes Tobb, Kardie, DO  metoprolol succinate (TOPROL-XL) 50 MG 24 hr tablet Take with or immediately following a meal. Take 50 mg in the morning and take 25 mg at night. 02/18/21  Yes Tobb, Kardie, DO  nitroGLYCERIN (NITROSTAT) 0.4 MG SL tablet  Place 0.4 mg under the tongue every 5 (five) minutes as needed for chest pain.   Yes [provider]  omeprazole (PRILOSEC) 20 MG capsule Take 20 mg by mouth daily.   Yes [provider]  sertraline (ZOLOFT) 100 MG tablet Take 1 tablet by mouth daily. 03/16/21  Yes [provider]  nitroGLYCERIN (NITROSTAT) 0.4 MG SL tablet Place 1 tablet (0.4 mg total) under the tongue every 5 (five) minutes as needed for chest pain. 06/19/20 09/17/20  Tobb, Lavona Mound, DO    Allergies    Patient has no known allergies.  Review of Systems   Review of Systems 10 systems reviewed and negative except as per HPI Physical Exam Updated Vital Signs BP (!) 122/102   Pulse 73   Temp 97.6 F (36.4 C) (Oral)   Resp 17   SpO2 100%   Physical Exam Constitutional:      Appearance: Normal appearance.     Comments: Clear mental status.  No respiratory distress.  Well in appearance.  HENT:     Mouth/Throat:     Pharynx: Oropharynx is clear.  Eyes:     Extraocular Movements: Extraocular movements intact.  Cardiovascular:     Comments: Irregularly irregular.  Borderline tachycardia.  No gross rub murmur gallop. Pulmonary:     Effort: Pulmonary effort is normal.     Breath sounds: Normal breath sounds.  Abdominal:     General: There is no distension.     Palpations: Abdomen is soft.     Tenderness: There is no abdominal tenderness. There is no guarding.  Musculoskeletal:        General: No swelling or tenderness. Normal range of motion.     Right lower leg: No edema.     Left lower leg: No edema.  Skin:    General: Skin is warm and dry.  Neurological:     General: No focal deficit present.     Mental Status: She is alert and oriented to person, place, and time.     Coordination: Coordination normal.  Psychiatric:        Mood and Affect: Mood normal.    ED Results / Procedures / Treatments   Labs (all labs ordered are listed, but only abnormal results are displayed) Labs Reviewed   BRAIN NATRIURETIC PEPTIDE - Abnormal; Notable for the following components:      Result Value   B Natriuretic Peptide 260.2 (*)    All other components within normal limits  URINALYSIS, ROUTINE W REFLEX MICROSCOPIC - Abnormal; Notable for the following components:   Color, Urine STRAW (*)    All other components within normal limits  RESP PANEL BY RT-PCR (FLU A&B, COVID) ARPGX2  CBC WITH DIFFERENTIAL/PLATELET  PROTIME-INR  TROPONIN I (HIGH SENSITIVITY)  TROPONIN I (HIGH SENSITIVITY)    EKG EKG Interpretation  Date/Time:  Saturday April 04 2021 11:19:11 EDT Ventricular Rate:  100 PR Interval:    QRS Duration: 90 QT Interval:  351 QTC Calculation: 453 R Axis:   -26 Text Interpretation: Atrial fibrillation Borderline left axis deviation agree, no acute ischemic appearance. Confirmed by Arby Barrette [phone]) on 04/04/2021 3:01:34 PM  Radiology DG Chest 2 View  Result Date: 04/04/2021 CLINICAL DATA:  Patient reports chest pain started this morning after she got up. Denies dizziness, SOB, N/V, or diaphoresis. Does report pain radiating into the left arm and describes it as tingling.Patient states she has been feeling sad and "not good" for > 1 week with and had an episode where she felt like she was going to pass out in a store. EXAM: CHEST - 2 VIEW COMPARISON:  05/24/2019 FINDINGS: Cardiac silhouette is borderline enlarged. No mediastinal or hilar masses. No evidence of adenopathy. Clear lungs.  No pleural effusion or pneumothorax. Skeletal structures are intact. IMPRESSION: No active cardiopulmonary disease. Electronically Signed   By: Amie Portland M.D.   On: 04/04/2021 13:14    Procedures Procedures   Medications Ordered in ED Medications  metoprolol succinate (TOPROL-XL) 24 hr tablet 25 mg (25 mg Oral Given 04/04/21 1405)    ED Course  I have reviewed the triage vital signs and the nursing notes.  Pertinent labs & imaging results that were available during my care of the  patient were reviewed by me and considered in my medical decision making (see chart for details).    MDM Rules/Calculators/A&P                          Patient presents as outlined.  Clinically she is well in appearance.  No distress.  No signs of volume overload.  No crackles on exam or dyspnea.  2 sets of troponins are negative.  Patient is anticoagulated and treated with metoprolol and flecainide for her atrial fibrillation.  Patient's rates in the emergency department ranged from 90s to low 100s.  At this time will add an additional 25 mg of Toprol to her base dose.  Patient is instructed on monitoring and documenting her heart rate and blood pressure with close follow-up with cardiology.  Return precautions reviewed. Final Clinical Impression(s) / ED Diagnoses Final diagnoses:  Precordial pain  Chronic a-fib (HCC)    Rx / DC Orders ED Discharge Orders     None        Arby Barrette, MD 04/04/21 1502

## 2021-04-04 NOTE — ED Notes (Signed)
EMS IVAD 20ga L hand removed with catheter tip intact. Bleeding controlled with gauze and tape. Patient denies  Chest pain,SOB,dizziness, or nausea. Patient discharged to care of daughter for transport home. Left ED ambulatory with steady independent gait and was accompanied by her daughter.

## 2021-04-04 NOTE — Discharge Instructions (Addendum)
1.  Call your cardiologist and let them know you are seen in the emergency department for chest pain.  You need a follow-up appointment as soon as possible.  Increase your Toprol-XL dose by an additional 25 mg.  At this time, if you are medical record indicates you are taking 50 mg.  Take 75 mg daily. 2.  Monitor your heart rate and blood pressure.  Keep a log.  Your goal heart rate will be between 60-80. 3.  Return to the emergency department if you get chest pain feel he will pass out or other concerning symptoms develop.

## 2021-04-16 ENCOUNTER — Other Ambulatory Visit: Payer: Self-pay | Admitting: *Deleted

## 2021-04-16 DIAGNOSIS — Z01812 Encounter for preprocedural laboratory examination: Secondary | ICD-10-CM | POA: Diagnosis not present

## 2021-04-16 DIAGNOSIS — I4819 Other persistent atrial fibrillation: Secondary | ICD-10-CM | POA: Diagnosis not present

## 2021-04-16 NOTE — Telephone Encounter (Signed)
Reviewed instructions. Pt aware to arrive at 6:30 am on 7/20 for ablation. Pt will stop by the [facility] office today for BMET. Patient verbalized understanding and agreeable to plan.

## 2021-04-17 LAB — BASIC METABOLIC PANEL
BUN/Creatinine Ratio: 17 (ref 12–28)
BUN: 14 mg/dL (ref 8–27)
CO2: 27 mmol/L (ref 20–29)
Calcium: 9.4 mg/dL (ref 8.7–10.3)
Chloride: 103 mmol/L (ref 96–106)
Creatinine, Ser: 0.83 mg/dL (ref 0.57–1.00)
Glucose: 87 mg/dL (ref 65–99)
Potassium: 4 mmol/L (ref 3.5–5.2)
Sodium: 143 mmol/L (ref 134–144)
eGFR: 73 mL/min/{1.73_m2} (ref >59)

## 2021-04-23 ENCOUNTER — Other Ambulatory Visit: Payer: Self-pay | Admitting: Family Medicine

## 2021-04-23 ENCOUNTER — Telehealth (HOSPITAL_COMMUNITY): Payer: Self-pay | Admitting: Emergency Medicine

## 2021-04-23 DIAGNOSIS — Z1231 Encounter for screening mammogram for malignant neoplasm of breast: Secondary | ICD-10-CM

## 2021-04-23 NOTE — Telephone Encounter (Signed)
Attempted to call patient regarding upcoming cardiac CT appointment. °Left message on voicemail with name and callback number °Kasey Hansell RN Navigator Cardiac Imaging °[facility] °[phone] Office °[phone] Cell ° °

## 2021-04-24 ENCOUNTER — Other Ambulatory Visit: Payer: Self-pay

## 2021-04-24 ENCOUNTER — Ambulatory Visit
Admission: RE | Admit: 2021-04-24 | Discharge: 2021-04-24 | Disposition: A | Discharge status: Home / Self Care | Payer: Medicare PPO | Source: Ambulatory Visit | Attending: Family Medicine | Admitting: Family Medicine

## 2021-04-24 DIAGNOSIS — Z1231 Encounter for screening mammogram for malignant neoplasm of breast: Secondary | ICD-10-CM | POA: Diagnosis not present

## 2021-04-27 ENCOUNTER — Ambulatory Visit (HOSPITAL_COMMUNITY)
Admission: RE | Admit: 2021-04-27 | Discharge: 2021-04-27 | Disposition: A | Discharge status: Home / Self Care | Payer: Medicare PPO | Source: Ambulatory Visit | Attending: Cardiology | Admitting: Cardiology

## 2021-04-27 ENCOUNTER — Encounter (HOSPITAL_COMMUNITY): Payer: Self-pay

## 2021-04-27 ENCOUNTER — Other Ambulatory Visit: Payer: Self-pay

## 2021-04-27 DIAGNOSIS — I4819 Other persistent atrial fibrillation: Secondary | ICD-10-CM | POA: Insufficient documentation

## 2021-04-27 MED ORDER — IOHEXOL 350 MG/ML SOLN
80.0000 mL | Freq: Once | INTRAVENOUS | Status: AC | PRN
  Administered 2021-04-27: 80 mL via INTRAVENOUS

## 2021-04-29 ENCOUNTER — Ambulatory Visit (HOSPITAL_COMMUNITY): Payer: Medicare PPO | Admitting: Anesthesiology

## 2021-04-29 ENCOUNTER — Ambulatory Visit (HOSPITAL_COMMUNITY)
Admission: RE | Admit: 2021-04-29 | Discharge: 2021-04-29 | Disposition: A | Discharge status: Home / Self Care | Payer: Medicare PPO | Attending: Cardiology | Admitting: Cardiology

## 2021-04-29 ENCOUNTER — Other Ambulatory Visit: Payer: Self-pay

## 2021-04-29 ENCOUNTER — Encounter (HOSPITAL_COMMUNITY)
Admission: RE | Disposition: A | Discharge status: Home / Self Care | Payer: Self-pay | Source: Home / Self Care | Attending: Cardiology

## 2021-04-29 ENCOUNTER — Encounter (HOSPITAL_COMMUNITY): Payer: Self-pay | Admitting: Cardiology

## 2021-04-29 DIAGNOSIS — I4819 Other persistent atrial fibrillation: Secondary | ICD-10-CM | POA: Insufficient documentation

## 2021-04-29 DIAGNOSIS — K449 Diaphragmatic hernia without obstruction or gangrene: Secondary | ICD-10-CM | POA: Diagnosis not present

## 2021-04-29 DIAGNOSIS — M069 Rheumatoid arthritis, unspecified: Secondary | ICD-10-CM | POA: Diagnosis not present

## 2021-04-29 DIAGNOSIS — I480 Paroxysmal atrial fibrillation: Secondary | ICD-10-CM | POA: Diagnosis not present

## 2021-04-29 DIAGNOSIS — E785 Hyperlipidemia, unspecified: Secondary | ICD-10-CM | POA: Diagnosis not present

## 2021-04-29 DIAGNOSIS — Z8249 Family history of ischemic heart disease and other diseases of the circulatory system: Secondary | ICD-10-CM | POA: Insufficient documentation

## 2021-04-29 DIAGNOSIS — E669 Obesity, unspecified: Secondary | ICD-10-CM | POA: Insufficient documentation

## 2021-04-29 DIAGNOSIS — Z6830 Body mass index (BMI) 30.0-30.9, adult: Secondary | ICD-10-CM | POA: Insufficient documentation

## 2021-04-29 DIAGNOSIS — I10 Essential (primary) hypertension: Secondary | ICD-10-CM | POA: Insufficient documentation

## 2021-04-29 HISTORY — PX: ATRIAL FIBRILLATION ABLATION: EP1191

## 2021-04-29 LAB — POCT ACTIVATED CLOTTING TIME
Activated Clotting Time: 260 seconds
Activated Clotting Time: 306 seconds

## 2021-04-29 SURGERY — ATRIAL FIBRILLATION ABLATION
Anesthesia: General

## 2021-04-29 MED ORDER — SODIUM CHLORIDE 0.9% FLUSH: 3.0000 mL | INTRAVENOUS | Status: DC | PRN

## 2021-04-29 MED ORDER — PROTAMINE SULFATE 10 MG/ML IV SOLN
INTRAVENOUS | Status: DC | PRN
  Administered 2021-04-29: 40 mg via INTRAVENOUS

## 2021-04-29 MED ORDER — ONDANSETRON HCL 4 MG/2ML IJ SOLN: 4.0000 mg | Freq: Four times a day (QID) | INTRAMUSCULAR | Status: DC | PRN

## 2021-04-29 MED ORDER — HEPARIN (PORCINE) IN NACL 1000-0.9 UT/500ML-% IV SOLN
INTRAVENOUS | Status: AC
  Filled 2021-04-29: qty 500

## 2021-04-29 MED ORDER — HEPARIN SODIUM (PORCINE) 1000 UNIT/ML IJ SOLN
INTRAMUSCULAR | Status: AC
  Filled 2021-04-29: qty 1

## 2021-04-29 MED ORDER — SODIUM CHLORIDE 0.9 % IV SOLN: 250.0000 mL | INTRAVENOUS | Status: DC | PRN

## 2021-04-29 MED ORDER — LIDOCAINE 2% (20 MG/ML) 5 ML SYRINGE
INTRAMUSCULAR | Status: DC | PRN
Start: 2021-04-29 — End: 2021-04-29
  Administered 2021-04-29: 60 mg via INTRAVENOUS

## 2021-04-29 MED ORDER — ROCURONIUM BROMIDE 100 MG/10ML IV SOLN
INTRAVENOUS | Status: DC | PRN
  Administered 2021-04-29: 50 mg via INTRAVENOUS

## 2021-04-29 MED ORDER — DEXAMETHASONE SODIUM PHOSPHATE 10 MG/ML IJ SOLN
INTRAMUSCULAR | Status: DC | PRN
Start: 2021-04-29 — End: 2021-04-29
  Administered 2021-04-29: 5 mg via INTRAVENOUS

## 2021-04-29 MED ORDER — SODIUM CHLORIDE 0.9% FLUSH: 3.0000 mL | Freq: Two times a day (BID) | INTRAVENOUS | Status: DC

## 2021-04-29 MED ORDER — HEPARIN SODIUM (PORCINE) 1000 UNIT/ML IJ SOLN
INTRAMUSCULAR | Status: DC | PRN
  Administered 2021-04-29: 1000 [IU] via INTRAVENOUS

## 2021-04-29 MED ORDER — ONDANSETRON HCL 4 MG/2ML IJ SOLN
INTRAMUSCULAR | Status: DC | PRN
  Administered 2021-04-29: 4 mg via INTRAVENOUS

## 2021-04-29 MED ORDER — APIXABAN 5 MG PO TABS
5.0000 mg | ORAL_TABLET | Freq: Once | ORAL | Status: AC
  Administered 2021-04-29: 5 mg via ORAL
  Filled 2021-04-29: qty 1

## 2021-04-29 MED ORDER — SUGAMMADEX SODIUM 200 MG/2ML IV SOLN
INTRAVENOUS | Status: DC | PRN
  Administered 2021-04-29: 160 mg via INTRAVENOUS
  Administered 2021-04-29: 40 mg via INTRAVENOUS

## 2021-04-29 MED ORDER — HEPARIN SODIUM (PORCINE) 1000 UNIT/ML IJ SOLN
INTRAMUSCULAR | Status: DC | PRN
  Administered 2021-04-29: 5000 [IU] via INTRAVENOUS
  Administered 2021-04-29: 14000 [IU] via INTRAVENOUS
  Administered 2021-04-29: 3000 [IU] via INTRAVENOUS

## 2021-04-29 MED ORDER — PHENYLEPHRINE HCL (PRESSORS) 10 MG/ML IV SOLN
INTRAVENOUS | Status: DC | PRN
  Administered 2021-04-29: 20 ug via INTRAVENOUS
  Administered 2021-04-29: 40 ug via INTRAVENOUS

## 2021-04-29 MED ORDER — HEPARIN (PORCINE) IN NACL 1000-0.9 UT/500ML-% IV SOLN
INTRAVENOUS | Status: DC | PRN
  Administered 2021-04-29 (×5): 500 mL

## 2021-04-29 MED ORDER — PROPOFOL 10 MG/ML IV BOLUS
INTRAVENOUS | Status: DC | PRN
  Administered 2021-04-29: 100 mg via INTRAVENOUS

## 2021-04-29 MED ORDER — ACETAMINOPHEN 325 MG PO TABS
650.0000 mg | ORAL_TABLET | ORAL | Status: DC | PRN
  Administered 2021-04-29: 650 mg via ORAL
  Filled 2021-04-29 (×3): qty 2

## 2021-04-29 MED ORDER — SODIUM CHLORIDE 0.9 % IV SOLN: INTRAVENOUS | Status: DC

## 2021-04-29 MED ORDER — FENTANYL CITRATE (PF) 100 MCG/2ML IJ SOLN
INTRAMUSCULAR | Status: DC | PRN
  Administered 2021-04-29: 100 ug via INTRAVENOUS

## 2021-04-29 MED ORDER — DOBUTAMINE IN D5W 4-5 MG/ML-% IV SOLN
INTRAVENOUS | Status: DC | PRN
  Administered 2021-04-29: 20 ug/kg/min via INTRAVENOUS

## 2021-04-29 MED ORDER — EPHEDRINE SULFATE-NACL 50-0.9 MG/10ML-% IV SOSY
PREFILLED_SYRINGE | INTRAVENOUS | Status: DC | PRN
  Administered 2021-04-29: 5 mg via INTRAVENOUS

## 2021-04-29 SURGICAL SUPPLY — 23 items
BAG SNAP BAND KOVER 36X36 (MISCELLANEOUS) ×2 IMPLANT
BLANKET WARM UNDERBOD FULL ACC (MISCELLANEOUS) ×2 IMPLANT
CATH 8FR REPROCESSED SOUNDSTAR (CATHETERS) ×2 IMPLANT
CATH OCTARAY 1.5 F (CATHETERS) ×2 IMPLANT
CATH S CIRCA THERM PROBE 10F (CATHETERS) ×2 IMPLANT
CATH SMTCH THERMOCOOL SF DF (CATHETERS) ×2 IMPLANT
CATH WEB BI DIR CSDF CRV REPRO (CATHETERS) ×2 IMPLANT
CLOSURE PERCLOSE PROSTYLE (VASCULAR PRODUCTS) ×8 IMPLANT
COVER DOME SNAP 22 D (MISCELLANEOUS) ×2 IMPLANT
COVER SWIFTLINK CONNECTOR (BAG) ×2 IMPLANT
KIT VERSACROSS FXD (WIRE) ×2 IMPLANT
KIT VERSACROSS STEERABLE D1 (CATHETERS) ×2 IMPLANT
MAT PREVALON FULL STRYKER (MISCELLANEOUS) ×2 IMPLANT
PACK EP LATEX FREE (CUSTOM PROCEDURE TRAY) ×2
PACK EP LF (CUSTOM PROCEDURE TRAY) ×1 IMPLANT
PAD PRO RADIOLUCENT 2001M-C (PAD) ×2 IMPLANT
PATCH CARTO3 (PAD) ×2 IMPLANT
SHEATH CARTO VIZIGO SM CVD (SHEATH) ×2 IMPLANT
SHEATH PINNACLE 7F 10CM (SHEATH) ×2 IMPLANT
SHEATH PINNACLE 8F 10CM (SHEATH) ×4 IMPLANT
SHEATH PINNACLE 9F 10CM (SHEATH) ×2 IMPLANT
SHEATH PROBE COVER 6X72 (BAG) ×2 IMPLANT
TUBING SMART ABLATE COOLFLOW (TUBING) ×2 IMPLANT

## 2021-04-29 NOTE — Transfer of Care (Signed)
Immediate Anesthesia Transfer of Care Note  Patient: Penny Hines  Procedure(s) Performed: ATRIAL FIBRILLATION ABLATION  Patient Location: PACU  Anesthesia Type:General  Level of Consciousness: awake and drowsy  Airway & Oxygen Therapy: Patient Spontanous Breathing and Patient connected to nasal cannula oxygen  Post-op Assessment: Report given to RN and Post -op Vital signs reviewed and stable  Post vital signs: Reviewed and stable  Last Vitals:  Vitals Value Taken Time  BP 125/69 04/29/21 1102  Temp    Pulse 73 04/29/21 1105  Resp 23 04/29/21 1105  SpO2 91 % 04/29/21 1105  Vitals shown include unvalidated device data.  Last Pain:  Vitals:   04/29/21 1045  TempSrc:   PainSc: 0-No pain         Complications: There were no known notable events for this encounter.

## 2021-04-29 NOTE — Anesthesia Procedure Notes (Addendum)
Procedure Name: Intubation Date/Time: 04/29/2021 8:57 AM Performed by: Erick Colace, RN Pre-anesthesia Checklist: Patient identified, Emergency Drugs available, Suction available and Patient being monitored Patient Re-evaluated:Patient Re-evaluated prior to induction Oxygen Delivery Method: Circle System Utilized Preoxygenation: Pre-oxygenation with 100% oxygen Induction Type: IV induction Ventilation: Mask ventilation without difficulty and Oral airway inserted - appropriate to patient size Laryngoscope Size: 3 and Mac Grade View: Grade I Tube type: Oral Tube size: 7.0 mm Number of attempts: 1 Airway Equipment and Method: Stylet and Oral airway Placement Confirmation: ETT inserted through vocal cords under direct vision, positive ETCO2 and breath sounds checked- equal and bilateral Secured at: 22 cm Tube secured with: Tape Dental Injury: Teeth and Oropharynx as per pre-operative assessment

## 2021-04-29 NOTE — Anesthesia Preprocedure Evaluation (Signed)
Anesthesia Evaluation  Patient identified by MRN, date of birth, ID band Patient awake    Reviewed: Allergy & Precautions, H&P , NPO status , Patient's Chart, lab work & pertinent test results  Airway Mallampati: II   Neck ROM: full    Dental   Pulmonary neg pulmonary ROS,    breath sounds clear to auscultation       Cardiovascular hypertension, + dysrhythmias Atrial Fibrillation  Rhythm:irregular Rate:Normal     Neuro/Psych PSYCHIATRIC DISORDERS Depression    GI/Hepatic hiatal hernia, GERD  ,  Endo/Other    Renal/GU      Musculoskeletal  (+) Arthritis , Rheumatoid disorders,    Abdominal   Peds  Hematology   Anesthesia Other Findings   Reproductive/Obstetrics                             Anesthesia Physical Anesthesia Plan  ASA: 3  Anesthesia Plan: General   Post-op Pain Management:    Induction: Intravenous  PONV Risk Score and Plan: 3 and Ondansetron, Dexamethasone, Midazolam and Treatment may vary due to age or medical condition  Airway Management Planned: Oral ETT  Additional Equipment:   Intra-op Plan:   Post-operative Plan: Extubation in OR  Informed Consent: I have reviewed the patients History and Physical, chart, labs and discussed the procedure including the risks, benefits and alternatives for the proposed anesthesia with the patient or authorized representative who has indicated his/her understanding and acceptance.     Dental advisory given  Plan Discussed with: CRNA, Anesthesiologist and Surgeon  Anesthesia Plan Comments:         Anesthesia Quick Evaluation

## 2021-04-29 NOTE — H&P (Signed)
Electrophysiology Office Note   Date:  04/29/2021   ID:  Penny, Virrueta Hines 08, 1947, MRN [id number]  PCP:  Alinda Deem, MD  Cardiologist:  Penny Hines:  Penny Escoe [name], MD    Chief Complaint: AF   History of Present Illness: Penny Hines is a 75 y.o. female who is being seen today for the evaluation of AF at the request of No ref. provider found. Presenting today for electrophysiology evaluation.  She has a history significant for hypertension, rheumatoid arthritis, hyperlipidemia, obesity and paroxysmal atrial fibrillation.  She feels like she went into atrial fibrillation approximately a month and a half ago.  Since that time, she has had much more fatigue, lethargy, and shortness of breath.  She is not able to do all of the activities that she would prefer.  She is able to take care of herself but is unable to exert herself due to her lethargy and shortness of breath.  She would like to get back into rhythm.  Today, denies symptoms of palpitations, chest pain, shortness of breath, orthopnea, PND, lower extremity edema, claudication, dizziness, presyncope, syncope, bleeding, or neurologic sequela. The patient is tolerating medications without difficulties.  plan ablation today.   Past Medical History:  Diagnosis Date   Current use of long term anticoagulation 10/10/2017   Depression    Esophageal dilatation    Essential hypertension 10/10/2017   GERD (gastroesophageal reflux disease)    Hiatal hernia    Paroxysmal atrial fibrillation (HCC) 10/10/2017   Rheumatoid arthritis (HCC)    Past Surgical History:  Procedure Laterality Date   CESAREAN SECTION  1976   REPLACEMENT TOTAL KNEE Right 2008     Current Facility-Administered Medications  Medication Dose Route Frequency Provider Last Rate Last Admin   0.9 %  sodium chloride infusion   Intravenous Continuous Regan Lemming, MD 50 mL/hr at 04/29/21 0701 New Bag at 04/29/21 0701     Allergies:   Patient has no known allergies.   Social History:  The patient  reports that she has never smoked. She has never used smokeless tobacco. She reports current alcohol use. She reports previous drug use.   Family History:  The patient's family history includes Diabetes in her brother and sister; Heart disease in her father; Hypertension in her father and mother; Lung cancer in her brother; Rheum arthritis in her sister; Stroke in her maternal grandmother.   ROS:  Please see the history of present illness.   Otherwise, review of systems is positive for none.   All other systems are reviewed and negative.   PHYSICAL EXAM: VS:  BP 108/78   Pulse 75   Temp 97.8 F (36.6 C) (Oral)   Ht 5' 3.5" (1.613 m)   Wt 80.3 kg   SpO2 97%   BMI 30.86 kg/m  , BMI Body mass index is 30.86 kg/m. GEN: Well nourished, well developed, in no acute distress  HEENT: normal  Neck: no JVD, carotid bruits, or masses Cardiac: iRRR; no murmurs, rubs, or gallops,no edema  Respiratory:  clear to auscultation bilaterally, normal work of breathing GI: soft, nontender, nondistended, + BS MS: no deformity or atrophy  Skin: warm and dry Neuro:  Strength and sensation are intact Psych: euthymic mood, full affect   Recent Labs: 02/18/2021: Magnesium 2.1 04/04/2021: B Natriuretic Peptide 260.2; Hemoglobin 13.0; Platelets 304 04/16/2021: BUN 14; Creatinine, Ser 0.83; Potassium 4.0; Sodium 143    Lipid Panel  No results found for: CHOL, TRIG, HDL,  CHOLHDL, VLDL, LDLCALC, LDLDIRECT   Wt Readings from Last 3 Encounters:  04/29/21 80.3 kg  03/10/21 79.9 kg  02/18/21 80.6 kg      Other studies Reviewed: Additional studies/ records that were reviewed today include: TTE 05/24/2019 Review of the above records today demonstrates:  Ejection fraction 50 to 55%.  LV wall thickness is normal. Left atrium is mildly dilated by volume  right atrium is mildly dilated moderate mitral regurgitation  Cardiac  monitor 07/09/2020 personally reviewed Max 210 bpm 08:50am, 09/10 Min 57 bpm 01:48am, 09/10 Avg 74 bpm Less than 1% ventricular and supraventricular ectopy Predominant rhythm sinus rhythm 5 runs of SVT, fastest and longest 16 beats at 210 bpm  ASSESSMENT AND PLAN:  1.  Persistent atrial fibrillation: Penny Hines has presented today for surgery, with the diagnosis of AF.  The various methods of treatment have been discussed with the patient and family. After consideration of risks, benefits and other options for treatment, the patient has consented to  Procedure(s): Catheter ablation as a surgical intervention .  Risks include but not limited to complete heart block, stroke, esophageal damage, nerve damage, bleeding, vascular damage, tamponade, perforation, MI, and death. The patient's history has been reviewed, patient examined, no change in status, stable for surgery.  I have reviewed the patient's chart and labs.  Questions were answered to the patient's satisfaction.    Penny Abad Elberta Fortis, MD 04/29/2021 7:22 AM

## 2021-04-30 ENCOUNTER — Encounter (HOSPITAL_COMMUNITY): Payer: Self-pay | Admitting: Cardiology

## 2021-04-30 DIAGNOSIS — S50812A Abrasion of left forearm, initial encounter: Secondary | ICD-10-CM | POA: Diagnosis not present

## 2021-04-30 NOTE — Anesthesia Postprocedure Evaluation (Signed)
Anesthesia Post Note  Patient: Penny Hines  Procedure(s) Performed: ATRIAL FIBRILLATION ABLATION     Patient location during evaluation: Cath Lab Anesthesia Type: General Level of consciousness: awake and alert Pain management: pain level controlled Vital Signs Assessment: post-procedure vital signs reviewed and stable Respiratory status: spontaneous breathing, nonlabored ventilation, respiratory function stable and patient connected to nasal cannula oxygen Cardiovascular status: blood pressure returned to baseline and stable Postop Assessment: no apparent nausea or vomiting Anesthetic complications: no   There were no known notable events for this encounter.  Last Vitals:  Vitals:   04/29/21 1331 04/29/21 1400  BP: 96/73 (!) 103/50  Pulse: 69 71  Resp: 18 19  Temp:    SpO2: 94% 96%    Last Pain:  Vitals:   04/29/21 1205  TempSrc:   PainSc: 6                  [name] S

## 2021-05-01 ENCOUNTER — Telehealth: Payer: Self-pay | Admitting: Cardiology

## 2021-05-01 NOTE — Telephone Encounter (Signed)
Patient said that when the IV was taken out after her ablation Wednesday she bled a lot. The Nurse put extra gauze and tape on it. When she went to take it off yesterday the top layer of skin came with it. She went to urgent care and had the wound re-dressed. It hurts when she tried to change the bandage because the skin is raw. She was not sure what to do.  She was also not sure if she needs to have an antibiotic called in. She is on a blood thinner. The patient saw Dr. Elberta Fortis in [place]   Please assist

## 2021-05-01 NOTE — Telephone Encounter (Signed)
Pt calling today to report a skin tear over her IV site. She states she attempted to remove the gauze over her IV site yesterday and she peeled a large area of skin off, which bled. She went to urgent care and they redressed the area with gauze and tape. Today she is calling because she is not sure how to care for the site without damaging the tissue further.  We discussed using normal saline or distilled water to loosen the gauze from her site. She may clean the site with soap and water, pat dry. Apply triple antibiotic ointment and use roll gauze to cover instead of gauze and tape. This should help avoid any further tissue damage. If her site becomes red, swollen, increased pain, or she develops a fever she should call her primary care or report to urgent care for evaluation.  She agrees with plan and had no additional question or concerns.

## 2021-05-06 DIAGNOSIS — S50812A Abrasion of left forearm, initial encounter: Secondary | ICD-10-CM | POA: Diagnosis not present

## 2021-05-27 ENCOUNTER — Ambulatory Visit (HOSPITAL_COMMUNITY): Payer: Medicare PPO | Admitting: Nurse Practitioner

## 2021-06-03 ENCOUNTER — Encounter (HOSPITAL_COMMUNITY): Payer: Self-pay | Admitting: Nurse Practitioner

## 2021-06-03 ENCOUNTER — Other Ambulatory Visit: Payer: Self-pay

## 2021-06-03 ENCOUNTER — Ambulatory Visit (HOSPITAL_COMMUNITY)
Admission: RE | Admit: 2021-06-03 | Discharge: 2021-06-03 | Disposition: A | Discharge status: Home / Self Care | Payer: Medicare PPO | Source: Ambulatory Visit | Attending: Nurse Practitioner | Admitting: Nurse Practitioner

## 2021-06-03 VITALS — BP 136/76 | HR 64 | Ht 63.5 in | Wt 180.0 lb

## 2021-06-03 DIAGNOSIS — D6869 Other thrombophilia: Secondary | ICD-10-CM | POA: Diagnosis not present

## 2021-06-03 DIAGNOSIS — Z79899 Other long term (current) drug therapy: Secondary | ICD-10-CM | POA: Diagnosis not present

## 2021-06-03 DIAGNOSIS — Z8249 Family history of ischemic heart disease and other diseases of the circulatory system: Secondary | ICD-10-CM | POA: Insufficient documentation

## 2021-06-03 DIAGNOSIS — Z7901 Long term (current) use of anticoagulants: Secondary | ICD-10-CM | POA: Insufficient documentation

## 2021-06-03 DIAGNOSIS — I480 Paroxysmal atrial fibrillation: Secondary | ICD-10-CM | POA: Insufficient documentation

## 2021-06-03 NOTE — Progress Notes (Signed)
Primary Care Physician: Alinda Deem, MD Referring Physician: Dr. Dorothy Puffer Penny Hines is a 75 y.o. female with a h/o afib that is s/p ablation one month ago. She reports that she has been maintaining  SR since the procedure. NO swallowing or groin issues.    Today, she denies symptoms of palpitations, chest pain, shortness of breath, orthopnea, PND, lower extremity edema, dizziness, presyncope, syncope, or neurologic sequela. The patient is tolerating medications without difficulties and is otherwise without complaint today.   Past Medical History:  Diagnosis Date   Current use of long term anticoagulation 10/10/2017   Depression    Esophageal dilatation    Essential hypertension 10/10/2017   GERD (gastroesophageal reflux disease)    Hiatal hernia    Paroxysmal atrial fibrillation (HCC) 10/10/2017   Rheumatoid arthritis (HCC)    Past Surgical History:  Procedure Laterality Date   ATRIAL FIBRILLATION ABLATION N/A 04/29/2021   Procedure: ATRIAL FIBRILLATION ABLATION;  Surgeon: Regan Lemming, MD;  [facility];  Service: Cardiovascular;  Laterality: N/A;   CESAREAN SECTION  1976   REPLACEMENT TOTAL KNEE Right 2008    Current Outpatient Medications  Medication Sig Dispense Refill   ALPRAZolam (XANAX) 0.5 MG tablet Take 0.5 mg by mouth daily as needed for anxiety.     ELIQUIS 5 MG TABS tablet Take 1 tablet (5 mg total) by mouth 2 (two) times daily. 60 tablet 12   flecainide (TAMBOCOR) 50 MG tablet Take 1 tablet (50 mg total) by mouth 2 (two) times daily. 60 tablet 3   ibuprofen (ADVIL) 200 MG tablet Take 400-600 mg by mouth every 8 (eight) hours as needed (for pain).     loratadine (CLARITIN) 10 MG tablet Take 10 mg by mouth as needed.     metoprolol succinate (TOPROL-XL) 50 MG 24 hr tablet Take with or immediately following a meal. Take 50 mg in the morning and take 25 mg at night. 90 tablet 3   nitroGLYCERIN (NITROSTAT) 0.4 MG SL tablet Place 0.4 mg  under the tongue every 5 (five) minutes x 3 doses as needed for chest pain.     omeprazole (PRILOSEC) 20 MG capsule Take 20 mg by mouth in the morning.     sertraline (ZOLOFT) 100 MG tablet Take 100 mg by mouth in the morning.     No current facility-administered medications for this encounter.    No Known Allergies  Social History   Socioeconomic History   Marital status: Single    Spouse name: Not on file   Number of children: Not on file   Years of education: Not on file   Highest education level: Not on file  Occupational History   Not on file  Tobacco Use   Smoking status: Never   Smokeless tobacco: Never  Substance and Sexual Activity   Alcohol use: Yes    Comment: Occasional   Drug use: Not Currently   Sexual activity: Not on file  Other Topics Concern   Not on file  Social History Narrative   Not on file   Social Determinants of Health   Financial Resource Strain: Not on file  Food Insecurity: Not on file  Transportation Needs: Not on file  Physical Activity: Not on file  Stress: Not on file  Social Connections: Not on file  Intimate Partner Violence: Not on file    Family History  Problem Relation Age of Onset   Hypertension Mother    Hypertension Father  Heart disease Father    Diabetes Sister    Rheum arthritis Sister    Stroke Maternal Grandmother    Lung cancer Brother    Diabetes Brother    Breast cancer Neg Hx     ROS- All systems are reviewed and negative except as per the HPI above  Physical Exam: Vitals:   06/03/21 1434  BP: 136/76  Pulse: 64  Weight: 81.6 kg  Height: 5' 3.5" (1.613 m)   Wt Readings from Last 3 Encounters:  06/03/21 81.6 kg  04/29/21 80.3 kg  03/10/21 79.9 kg    Labs: Lab Results  Component Value Date   NA 143 04/16/2021   K 4.0 04/16/2021   CL 103 04/16/2021   CO2 27 04/16/2021   GLUCOSE 87 04/16/2021   BUN 14 04/16/2021   CREATININE 0.83 04/16/2021   CALCIUM 9.4 04/16/2021   MG 2.1 02/18/2021    Lab Results  Component Value Date   INR 1.1 04/04/2021   No results found for: CHOL, HDL, LDLCALC, TRIG   GEN- The patient is well appearing, alert and oriented x 3 today.   Head- normocephalic, atraumatic Eyes-  Sclera clear, conjunctiva pink Ears- hearing intact Oropharynx- clear Neck- supple, no JVP Lymph- no cervical lymphadenopathy Lungs- Clear to ausculation bilaterally, normal work of breathing Heart- Regular rate and rhythm, no murmurs, rubs or gallops, PMI not laterally displaced GI- soft, NT, ND, + BS Extremities- no clubbing, cyanosis, or edema MS- no significant deformity or atrophy Skin- no rash or lesion Psych- euthymic mood, full affect Neuro- strength and sensation are intact  EKG-Vent. rate 64 BPM PR interval 150 ms QRS duration 82 ms QT/QTcB 406/418 ms P-R-T axes 29 -25 44 Normal sinus rhythm Minimal voltage criteria for LVH, may be normal variant ( R in aVL ) Anterior infarct , age undetermined Abnormal ECG    Assessment and Plan:  1. Afib S/p ablation one month ago No afib to report  She will continue on flecainide and metoprolol   2. CHA2DS2VASc  score of 4 Continue eliquis 5 mg bid  without interuption She has been having a toothache and taking 600-800 mg ibuprofen as needed She was warned re a possible GI bleed with the eliquis and to use tylenol instead  She as encouraged to get an appointment with her  dentist  F/u with Dr. Elberta Fortis 10/17  Lupita Leash C. Matthew Folks [facility] [address] [place], [place] [address] [phone]

## 2021-07-01 DIAGNOSIS — M0579 Rheumatoid arthritis with rheumatoid factor of multiple sites without organ or systems involvement: Secondary | ICD-10-CM | POA: Diagnosis not present

## 2021-07-01 DIAGNOSIS — M7989 Other specified soft tissue disorders: Secondary | ICD-10-CM | POA: Diagnosis not present

## 2021-07-01 DIAGNOSIS — M159 Polyosteoarthritis, unspecified: Secondary | ICD-10-CM | POA: Diagnosis not present

## 2021-07-01 DIAGNOSIS — M5136 Other intervertebral disc degeneration, lumbar region: Secondary | ICD-10-CM | POA: Diagnosis not present

## 2021-07-01 DIAGNOSIS — M19041 Primary osteoarthritis, right hand: Secondary | ICD-10-CM | POA: Diagnosis not present

## 2021-07-01 DIAGNOSIS — G8929 Other chronic pain: Secondary | ICD-10-CM | POA: Diagnosis not present

## 2021-07-01 DIAGNOSIS — Z79899 Other long term (current) drug therapy: Secondary | ICD-10-CM | POA: Diagnosis not present

## 2021-07-01 DIAGNOSIS — M545 Low back pain, unspecified: Secondary | ICD-10-CM | POA: Diagnosis not present

## 2021-07-01 DIAGNOSIS — M79642 Pain in left hand: Secondary | ICD-10-CM | POA: Diagnosis not present

## 2021-07-20 DIAGNOSIS — M069 Rheumatoid arthritis, unspecified: Secondary | ICD-10-CM | POA: Diagnosis not present

## 2021-07-20 DIAGNOSIS — E669 Obesity, unspecified: Secondary | ICD-10-CM | POA: Diagnosis not present

## 2021-07-20 DIAGNOSIS — I480 Paroxysmal atrial fibrillation: Secondary | ICD-10-CM | POA: Diagnosis not present

## 2021-07-20 DIAGNOSIS — F331 Major depressive disorder, recurrent, moderate: Secondary | ICD-10-CM | POA: Diagnosis not present

## 2021-07-20 DIAGNOSIS — Z6831 Body mass index (BMI) 31.0-31.9, adult: Secondary | ICD-10-CM | POA: Diagnosis not present

## 2021-07-27 ENCOUNTER — Other Ambulatory Visit: Payer: Self-pay

## 2021-07-27 ENCOUNTER — Ambulatory Visit: Payer: Medicare PPO | Admitting: Cardiology

## 2021-07-27 ENCOUNTER — Encounter: Payer: Self-pay | Admitting: Cardiology

## 2021-07-27 VITALS — BP 113/75 | HR 75 | Ht 64.0 in | Wt 177.0 lb

## 2021-07-27 DIAGNOSIS — I4819 Other persistent atrial fibrillation: Secondary | ICD-10-CM

## 2021-07-27 NOTE — Progress Notes (Signed)
Electrophysiology Office Note   Date:  07/27/2021   ID:  Penny Hines, DOB Jul 05, 1946, MRN [id number]  PCP:  Penny Deem, MD  Cardiologist:  Penny Hines Primary Electrophysiologist:  Penny Moten [name], MD    Chief Complaint: AF   History of Present Illness: Penny Hines is a 75 y.o. female who is being seen today for the evaluation of AF at the request of Penny Deem, MD. Presenting today for electrophysiology evaluation.  She has a history significant for hypertension, rheumatoid arthritis, hyperlipidemia, obesity, paroxysmal atrial fibrillation.  She feels fatigue, lethargy, shortness of breath of atrial fibrillation.  She is on Eliquis and metoprolol.  As she was having more frequent episodes of atrial fibrillation, she is now status post ablation 04/29/2021.  Today, denies symptoms of palpitations, chest pain, shortness of breath, orthopnea, PND, lower extremity edema, claudication, dizziness, presyncope, syncope, bleeding, or neurologic sequela. The patient is tolerating medications without difficulties.  Since her ablation she has done well.  She has had no chest pain or shortness of breath.  She is able to all of her daily activities without restriction.  She is noted no further episodes of atrial fibrillation.  She did have one episode where she thought she felt palpitations but checked on her heart monitor and confirmed that she was in sinus rhythm.  She was under quite a bit of stress at the time.   Past Medical History:  Diagnosis Date   Current use of long term anticoagulation 10/10/2017   Depression    Esophageal dilatation    Essential hypertension 10/10/2017   GERD (gastroesophageal reflux disease)    Hiatal hernia    Paroxysmal atrial fibrillation (HCC) 10/10/2017   Rheumatoid arthritis (HCC)    Past Surgical History:  Procedure Laterality Date   ATRIAL FIBRILLATION ABLATION N/A 04/29/2021   Procedure: ATRIAL FIBRILLATION ABLATION;  Surgeon: Penny Lemming, MD;  [facility];  Service: Cardiovascular;  Laterality: N/A;   CESAREAN SECTION  1976   REPLACEMENT TOTAL KNEE Right 2008     Current Outpatient Medications  Medication Sig Dispense Refill   ALPRAZolam (XANAX) 0.5 MG tablet Take 0.5 mg by mouth daily as needed for anxiety.     ELIQUIS 5 MG TABS tablet Take 1 tablet (5 mg total) by mouth 2 (two) times daily. 60 tablet 12   flecainide (TAMBOCOR) 50 MG tablet Take 1 tablet (50 mg total) by mouth 2 (two) times daily. 60 tablet 3   ibuprofen (ADVIL) 200 MG tablet Take 400-600 mg by mouth every 8 (eight) hours as needed (for pain).     loratadine (CLARITIN) 10 MG tablet Take 10 mg by mouth as needed.     metoprolol succinate (TOPROL-XL) 50 MG 24 hr tablet Take with or immediately following a meal. Take 50 mg in the morning and take 25 mg at night. 90 tablet 3   nitroGLYCERIN (NITROSTAT) 0.4 MG SL tablet Place 0.4 mg under the tongue every 5 (five) minutes x 3 doses as needed for chest pain.     omeprazole (PRILOSEC) 20 MG capsule Take 20 mg by mouth in the morning.     sertraline (ZOLOFT) 100 MG tablet Take 100 mg by mouth in the morning.     No current facility-administered medications for this visit.    Allergies:   Patient has no known allergies.   Social History:  The patient  reports that she has never smoked. She has never used smokeless tobacco. She reports current  alcohol use. She reports that she does not currently use drugs.   Family History:  The patient's family history includes Diabetes in her brother and sister; Heart disease in her father; Hypertension in her father and mother; Lung cancer in her brother; Rheum arthritis in her sister; Stroke in her maternal grandmother.   ROS:  Please see the history of present illness.   Otherwise, review of systems is positive for none.   All other systems are reviewed and negative.   PHYSICAL EXAM: VS:  BP 113/75   Pulse 75   Ht 5\' 4"  (1.626 m)   Wt 177 lb (80.3  kg)   SpO2 97%   BMI 30.38 kg/m  , BMI Body mass index is 30.38 kg/m. GEN: Well nourished, well developed, in no acute distress  HEENT: normal  Neck: no JVD, carotid bruits, or masses Cardiac: RRR; no murmurs, rubs, or gallops,no edema  Respiratory:  clear to auscultation bilaterally, normal work of breathing GI: soft, nontender, nondistended, + BS MS: no deformity or atrophy  Skin: warm and dry Neuro:  Strength and sensation are intact Psych: euthymic mood, full affect  EKG:  EKG is ordered today. Personal review of the ekg ordered shows sinus rhythm, rate 75  Recent Labs: 02/18/2021: Magnesium 2.1 04/04/2021: B Natriuretic Peptide 260.2; Hemoglobin 13.0; Platelets 304 04/16/2021: BUN 14; Creatinine, Ser 0.83; Potassium 4.0; Sodium 143    Lipid Panel  No results found for: CHOL, TRIG, HDL, CHOLHDL, VLDL, LDLCALC, LDLDIRECT   Wt Readings from Last 3 Encounters:  07/27/21 177 lb (80.3 kg)  06/03/21 180 lb (81.6 kg)  04/29/21 177 lb (80.3 kg)      Other studies Reviewed: Additional studies/ records that were reviewed today include: TTE 05/24/2019 Review of the above records today demonstrates:  Ejection fraction 50 to 55%.  LV wall thickness is normal. Left atrium is mildly dilated by volume  right atrium is mildly dilated moderate mitral regurgitation  Cardiac monitor 07/09/2020 personally reviewed Max 210 bpm 08:50am, 09/10 Min 57 bpm 01:48am, 09/10 Avg 74 bpm Less than 1% ventricular and supraventricular ectopy Predominant rhythm sinus rhythm 5 runs of SVT, fastest and longest 16 beats at 210 bpm  ASSESSMENT AND PLAN:  1.  Persistent atrial fibrillation: Currently on flecainide 50 mg twice daily, Toprol-XL 50 mg daily, Eliquis 5 mg twice daily.  High risk medication monitoring via ECG.  CHA2DS2-VASc of 4.  She is now status post atrial fibrillation ablation 04/29/2021.  She is remained in sinus rhythm, Penny Hines stop her flecainide.  We Penny Hines have her continue her metoprolol  and Eliquis.  2.  Hypertension: Currently well controlled  Current medicines are reviewed at length with the patient today.   The patient does not have concerns regarding her medicines.  The following changes were made today: Stop flecainide  Labs/ tests ordered today include:  Orders Placed This Encounter  Procedures   EKG 12-Lead      Disposition:   FU with Ingra Rother 3 months  Signed, Kynzleigh Bandel 05/01/2021, MD  07/27/2021 3:17 PM     [facility] [address] [place] [phone] (office) [phone] (fax)

## 2021-07-27 NOTE — Progress Notes (Deleted)
Electrophysiology Office Note   Date:  07/27/2021   ID:  Penny Hines, DOB [birth year], MRN [id number]  PCP:  Alinda Deem, MD  Cardiologist:  [place] Primary Electrophysiologist:  Kady Toothaker [name], MD    Chief Complaint: AF   History of Present Illness: Penny Hines is a 75 y.o. female who is being seen today for the evaluation of AF at the request of Alinda Deem, MD. Presenting today for electrophysiology evaluation.  She has a history significant for hypertension, rheumatoid arthritis, hyperlipidemia, obesity, paroxysmal atrial fibrillation.  She feels fatigue, lethargy, shortness of breath of atrial fibrillation.  She is on Eliquis and metoprolol.  As she was having more frequent episodes of atrial fibrillation, she is now status post ablation 04/29/2021.  Today, denies symptoms of palpitations, chest pain, shortness of breath, orthopnea, PND, lower extremity edema, claudication, dizziness, presyncope, syncope, bleeding, or neurologic sequela. The patient is tolerating medications without difficulties. ***    Past Medical History:  Diagnosis Date   Current use of long term anticoagulation 10/10/2017   Depression    Esophageal dilatation    Essential hypertension 10/10/2017   GERD (gastroesophageal reflux disease)    Hiatal hernia    Paroxysmal atrial fibrillation (HCC) 10/10/2017   Rheumatoid arthritis (HCC)    Past Surgical History:  Procedure Laterality Date   ATRIAL FIBRILLATION ABLATION N/A 04/29/2021   Procedure: ATRIAL FIBRILLATION ABLATION;  Surgeon: Regan Lemming, MD;  [facility];  Service: Cardiovascular;  Laterality: N/A;   CESAREAN SECTION  1976   REPLACEMENT TOTAL KNEE Right 2008     Current Outpatient Medications  Medication Sig Dispense Refill   ALPRAZolam (XANAX) 0.5 MG tablet Take 0.5 mg by mouth daily as needed for anxiety.     ELIQUIS 5 MG TABS tablet Take 1 tablet (5 mg total) by mouth 2 (two) times daily. 60 tablet  12   flecainide (TAMBOCOR) 50 MG tablet Take 1 tablet (50 mg total) by mouth 2 (two) times daily. 60 tablet 3   ibuprofen (ADVIL) 200 MG tablet Take 400-600 mg by mouth every 8 (eight) hours as needed (for pain).     loratadine (CLARITIN) 10 MG tablet Take 10 mg by mouth as needed.     metoprolol succinate (TOPROL-XL) 50 MG 24 hr tablet Take with or immediately following a meal. Take 50 mg in the morning and take 25 mg at night. 90 tablet 3   nitroGLYCERIN (NITROSTAT) 0.4 MG SL tablet Place 0.4 mg under the tongue every 5 (five) minutes x 3 doses as needed for chest pain.     omeprazole (PRILOSEC) 20 MG capsule Take 20 mg by mouth in the morning.     sertraline (ZOLOFT) 100 MG tablet Take 100 mg by mouth in the morning.     No current facility-administered medications for this visit.    Allergies:   Patient has no known allergies.   Social History:  The patient  reports that she has never smoked. She has never used smokeless tobacco. She reports current alcohol use. She reports that she does not currently use drugs.   Family History:  The patient's family history includes Diabetes in her brother and sister; Heart disease in her father; Hypertension in her father and mother; Lung cancer in her brother; Rheum arthritis in her sister; Stroke in her maternal grandmother.   ROS:  Please see the history of present illness.   Otherwise, review of systems is positive for none.   All  other systems are reviewed and negative.   PHYSICAL EXAM: VS:  There were no vitals taken for this visit. , BMI There is no height or weight on file to calculate BMI. GEN: Well nourished, well developed, in no acute distress  HEENT: normal  Neck: no JVD, carotid bruits, or masses Cardiac: ***RRR; no murmurs, rubs, or gallops,no edema  Respiratory:  clear to auscultation bilaterally, normal work of breathing GI: soft, nontender, nondistended, + BS MS: no deformity or atrophy  Skin: warm and dry Neuro:  Strength and  sensation are intact Psych: euthymic mood, full affect  EKG:  EKG {ACTION; IS/IS [id number]} ordered today. Personal review of the ekg ordered *** shows ***   Recent Labs: 02/18/2021: Magnesium 2.1 04/04/2021: B Natriuretic Peptide 260.2; Hemoglobin 13.0; Platelets 304 04/16/2021: BUN 14; Creatinine, Ser 0.83; Potassium 4.0; Sodium 143    Lipid Panel  No results found for: CHOL, TRIG, HDL, CHOLHDL, VLDL, LDLCALC, LDLDIRECT   Wt Readings from Last 3 Encounters:  06/03/21 180 lb (81.6 kg)  04/29/21 177 lb (80.3 kg)  03/10/21 176 lb 3.2 oz (79.9 kg)      Other studies Reviewed: Additional studies/ records that were reviewed today include: TTE 05/24/2019 Review of the above records today demonstrates:  Ejection fraction 50 to 55%.  LV wall thickness is normal. Left atrium is mildly dilated by volume  right atrium is mildly dilated moderate mitral regurgitation  Cardiac monitor 07/09/2020 personally reviewed Max 210 bpm 08:50am, 09/10 Min 57 bpm 01:48am, 09/10 Avg 74 bpm Less than 1% ventricular and supraventricular ectopy Predominant rhythm sinus rhythm 5 runs of SVT, fastest and longest 16 beats at 210 bpm  ASSESSMENT AND PLAN:  1.  Persistent atrial fibrillation: Currently on flecainide 50 mg twice daily, Toprol-XL 50 mg daily, Eliquis 5 mg twice daily.  High risk medication monitoring via ECG.  CHA2DS2-VASc of 4.  She is now status post atrial fibrillation ablation 04/29/2021.  2.  Hypertension:***  Current medicines are reviewed at length with the patient today.   The patient does not have concerns regarding her medicines.  The following changes were made today:  ***  Labs/ tests ordered today include:  No orders of the defined types were placed in this encounter.    Disposition:   FU with Oaklyn Jakubek *** months  Signed, Jamonta Goerner [name], MD  07/27/2021 8:55 AM     [facility] [address] [place] [address] [phone]  (office) [phone] (fax)

## 2021-07-27 NOTE — Patient Instructions (Addendum)
Medication Instructions:  Your physician has recommended you make the following change in your medication:  STOP Flecainide TAKE Toprol at bedtime  *If you need a refill on your cardiac medications before your next appointment, please call your pharmacy*   Lab Work: None ordered   Testing/Procedures: None ordered   Follow-Up: At [facility], you and your health needs are our priority.  As part of our continuing mission to provide you with exceptional heart care, we have created designated Provider Care Teams.  These Care Teams include your primary Cardiologist (physician) and Advanced Practice Providers (APPs -  Physician Assistants and Nurse Practitioners) who all work together to provide you with the care you need, when you need it.  We recommend signing up for the patient portal called "MyChart".  Sign up information is provided on this After Visit Summary.  MyChart is used to connect with patients for Virtual Visits (Telemedicine).  Patients are able to view lab/test results, encounter notes, upcoming appointments, etc.  Non-urgent messages can be sent to your provider as well.   To learn more about what you can do with MyChart, go to [email or web address].    Your next appointment:   3 month(s)  The format for your next appointment:   In Person  Provider:   Loman Brooklyn, MD    Thank you for choosing [facility]!!   Dory Horn, RN [phone]

## 2021-08-26 DIAGNOSIS — Z6831 Body mass index (BMI) 31.0-31.9, adult: Secondary | ICD-10-CM | POA: Diagnosis not present

## 2021-08-26 DIAGNOSIS — I480 Paroxysmal atrial fibrillation: Secondary | ICD-10-CM | POA: Diagnosis not present

## 2021-08-26 DIAGNOSIS — K219 Gastro-esophageal reflux disease without esophagitis: Secondary | ICD-10-CM | POA: Diagnosis not present

## 2021-08-26 DIAGNOSIS — I10 Essential (primary) hypertension: Secondary | ICD-10-CM | POA: Diagnosis not present

## 2021-08-26 DIAGNOSIS — Z0000 Encounter for general adult medical examination without abnormal findings: Secondary | ICD-10-CM | POA: Diagnosis not present

## 2021-08-26 DIAGNOSIS — M069 Rheumatoid arthritis, unspecified: Secondary | ICD-10-CM | POA: Diagnosis not present

## 2021-08-26 DIAGNOSIS — F331 Major depressive disorder, recurrent, moderate: Secondary | ICD-10-CM | POA: Diagnosis not present

## 2021-08-26 DIAGNOSIS — E669 Obesity, unspecified: Secondary | ICD-10-CM | POA: Diagnosis not present

## 2021-11-02 DIAGNOSIS — K5900 Constipation, unspecified: Secondary | ICD-10-CM | POA: Diagnosis not present

## 2021-11-02 DIAGNOSIS — K219 Gastro-esophageal reflux disease without esophagitis: Secondary | ICD-10-CM | POA: Diagnosis not present

## 2021-11-02 DIAGNOSIS — R110 Nausea: Secondary | ICD-10-CM | POA: Diagnosis not present

## 2021-11-16 ENCOUNTER — Ambulatory Visit: Payer: Medicare PPO | Admitting: Cardiology

## 2021-12-17 DIAGNOSIS — M25511 Pain in right shoulder: Secondary | ICD-10-CM | POA: Diagnosis not present

## 2021-12-18 DIAGNOSIS — M542 Cervicalgia: Secondary | ICD-10-CM | POA: Diagnosis not present

## 2022-01-20 ENCOUNTER — Telehealth: Payer: Self-pay | Admitting: *Deleted

## 2022-01-20 NOTE — Telephone Encounter (Signed)
? ?  Pre-operative Risk Assessment  ?  ?Patient Name: YOKO BRIGGS  ?DOB: 08/09/1946 ?MRN: [id number]  ? ? ? ?Request for Surgical Clearance ? ? ?Procedure:   I CALLED THE DENTAL OFFICE FOR CLARIFICATION AS TO THE PROCEDURE TO BE DONE AT THIS TIME. WE CANNOT PROVIDE A BLANKET TYPE CLEARANCE; DENTAL OFFICE WAS CLOSED FOR LUNCH. I WILL FAX NOTES TO DENTAL OFFICE TO PLEASE RESEND CLEARANCE STATING ONLY THE PROCEDURE TO BE DONE AT THIS TIME ? ?Date of Surgery:  Clearance TBD                              ?   ?Surgeon:  NEED NAME OF DENTIST OR PERFORMING PROVIDER ?Surgeon's Group or Practice Name:  [facility]  ?Phone number:  [phone] ?Fax number:  [phone] ?  ?Type of Clearance Requested:   ?- Medical  ?- Pharmacy:  Hold Apixaban (Eliquis)   ?  ?Type of Anesthesia:  Local  ?  ?Additional requests/questions:   ? ?Signed, ?Julaine Hua   ?01/20/2022, 1:54 PM  ? ?

## 2022-01-21 NOTE — Telephone Encounter (Signed)
? ?  Primary Cardiologist: Thomasene Ripple, DO ? ?Chart reviewed as part of pre-operative protocol coverage. Simple dental extractions (1-2 teeth) are considered low risk procedures per guidelines and generally do not require any specific cardiac clearance. It is also generally accepted that for simple extractions and dental cleanings, there is no need to interrupt blood thinner therapy.  ? ?SBE prophylaxis is not required for the patient. ? ?I will route this recommendation to the requesting party via Epic fax function and remove from pre-op pool. ? ?Please call with questions. ? ?Levi Aland, NP-C ? ?  ?01/21/2022, 12:10 PM ?[facility] ?[address] ?Office [phone] Fax [phone] ? ? ?

## 2022-01-21 NOTE — Telephone Encounter (Signed)
Call placed to [facility], per Marchelle Folks, pt is having 2 surgical extractions with local anesthetics.  ?

## 2022-01-25 NOTE — Telephone Encounter (Signed)
Clearance letter re-faxed.

## 2022-01-25 NOTE — Telephone Encounter (Signed)
?  [facility] calling, they said they did not receive clearance, requested if we can refax it to them, verified fax#[phone] ?

## 2022-01-26 NOTE — Telephone Encounter (Addendum)
Will route to callback to assist to ensure clearance gets transmitted. See APP note 01/21/22. ?

## 2022-01-26 NOTE — Telephone Encounter (Signed)
I have re-faxed  as well have emailed to dental office with email that was provided to our office.  ?

## 2022-01-26 NOTE — Telephone Encounter (Signed)
[facility] called and said they did not receive the fax either time. Fax number [phone] was verified .  ?If possible could the office e-mail it to  [email or web address] ?

## 2022-02-08 DIAGNOSIS — R202 Paresthesia of skin: Secondary | ICD-10-CM | POA: Diagnosis not present

## 2022-02-08 DIAGNOSIS — M542 Cervicalgia: Secondary | ICD-10-CM | POA: Diagnosis not present

## 2022-02-12 DIAGNOSIS — M542 Cervicalgia: Secondary | ICD-10-CM | POA: Diagnosis not present

## 2022-02-12 DIAGNOSIS — M4003 Postural kyphosis, cervicothoracic region: Secondary | ICD-10-CM | POA: Diagnosis not present

## 2022-02-15 ENCOUNTER — Ambulatory Visit: Payer: Medicare PPO | Admitting: Cardiology

## 2022-02-18 DIAGNOSIS — M542 Cervicalgia: Secondary | ICD-10-CM | POA: Diagnosis not present

## 2022-05-19 ENCOUNTER — Other Ambulatory Visit: Payer: Self-pay | Admitting: Cardiology

## 2022-05-19 ENCOUNTER — Telehealth: Payer: Self-pay | Admitting: Cardiology

## 2022-05-19 MED ORDER — METOPROLOL SUCCINATE ER 50 MG PO TB24: ORAL_TABLET | ORAL | 0 refills | Status: DC

## 2022-05-19 NOTE — Telephone Encounter (Signed)
*  STAT* If patient is at the pharmacy, call can be transferred to refill team.   1. Which medications need to be refilled? (please list name of each medication and dose if known) new prescription for Metoprolol  2. Which pharmacy/location (including street and city if local pharmacy) is medication to be sent to? [facility], [place]  3. Do they need a 30 day or 90 day supply? 90 days and refills- please call today if possible, been without it for 2 weeks

## 2022-07-03 IMAGING — MG MM DIGITAL SCREENING BILAT W/ TOMO AND CAD
8 series · 8 of 24 positions shown · non-contrast
Comparison: Previous exam(s).

CLINICAL DATA: Screening.

EXAM:
DIGITAL SCREENING BILATERAL MAMMOGRAM WITH TOMOSYNTHESIS AND CAD
TECHNIQUE: Bilateral screening digital craniocaudal and mediolateral oblique
mammograms were obtained. Bilateral screening digital breast
tomosynthesis was performed. The images were evaluated with
computer-aided detection.

[L CC synth-2D]
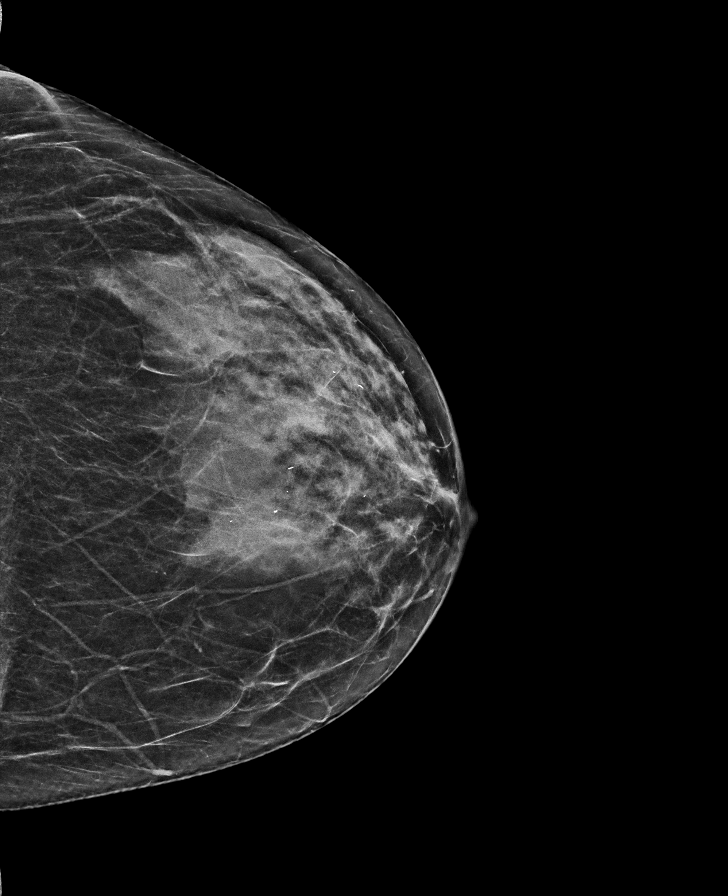

[R MLO synth-2D]
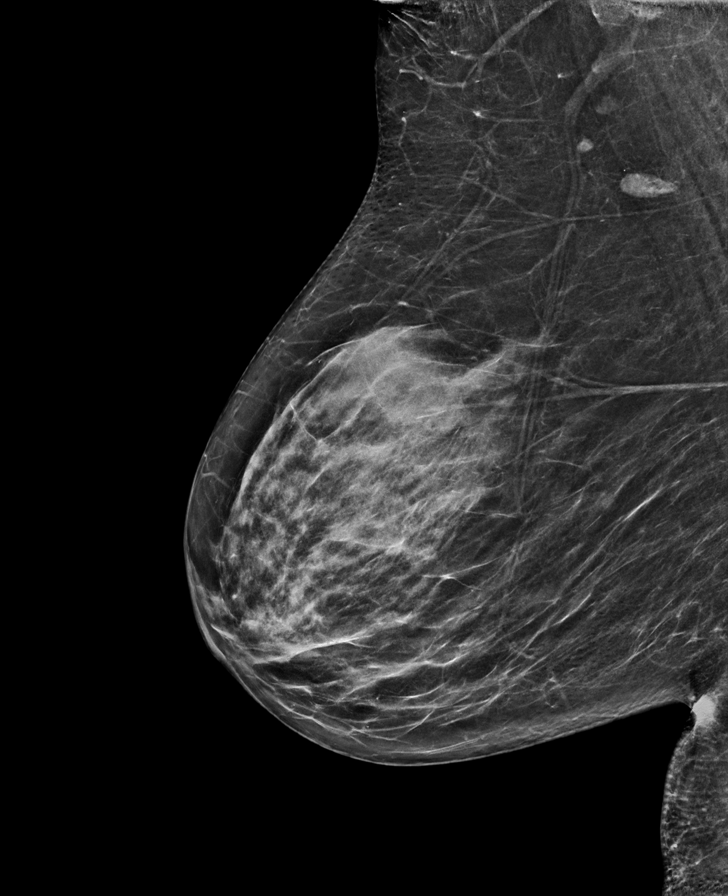

[L MLO synth-2D]
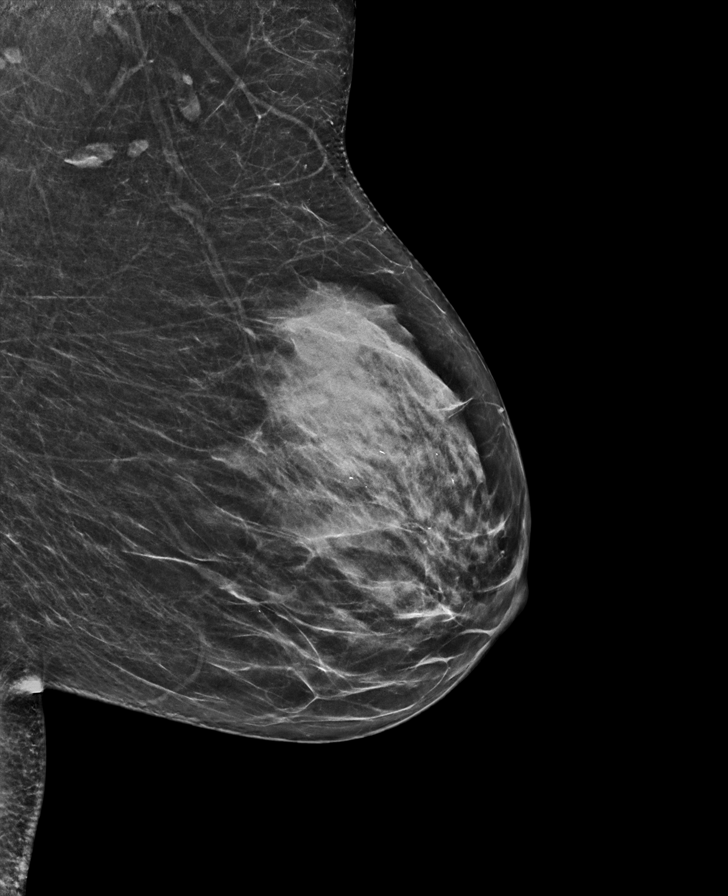

[R CC synth-2D]
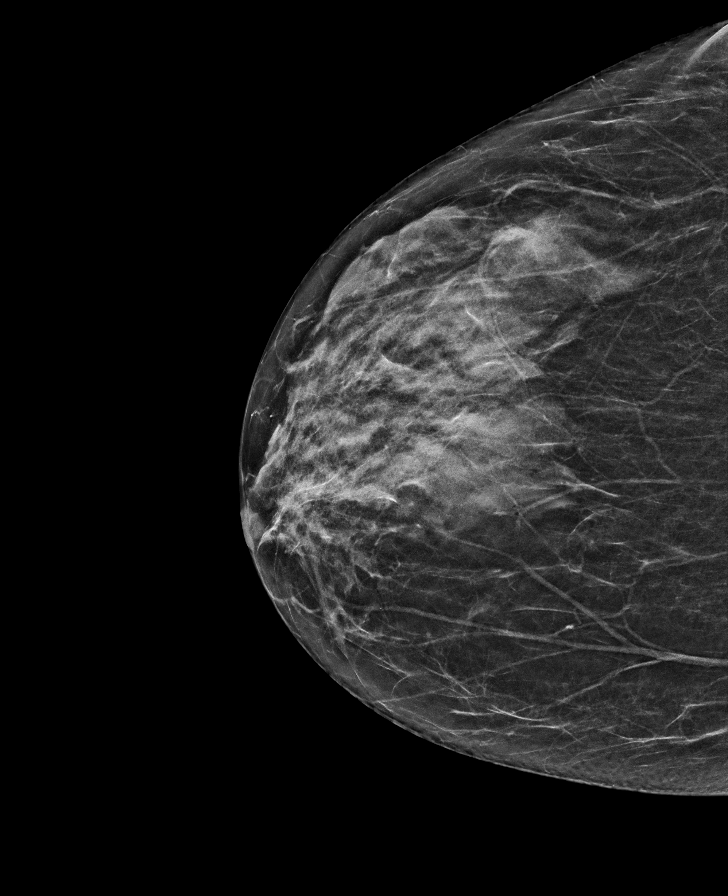

[R CC tomo · tomo slice 28/55.0]
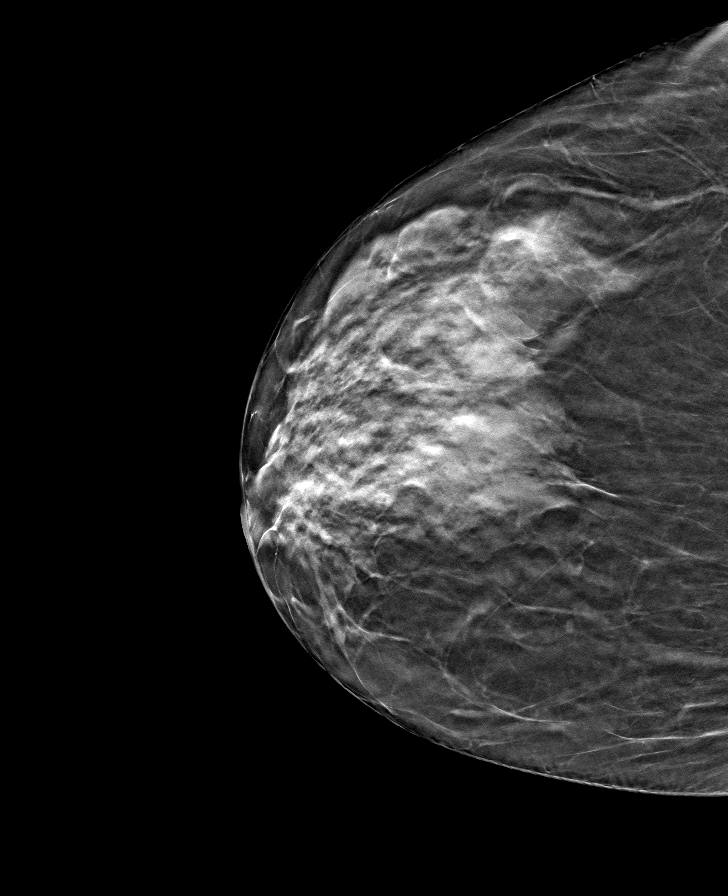

[L MLO tomo · tomo slice 31/62.0]
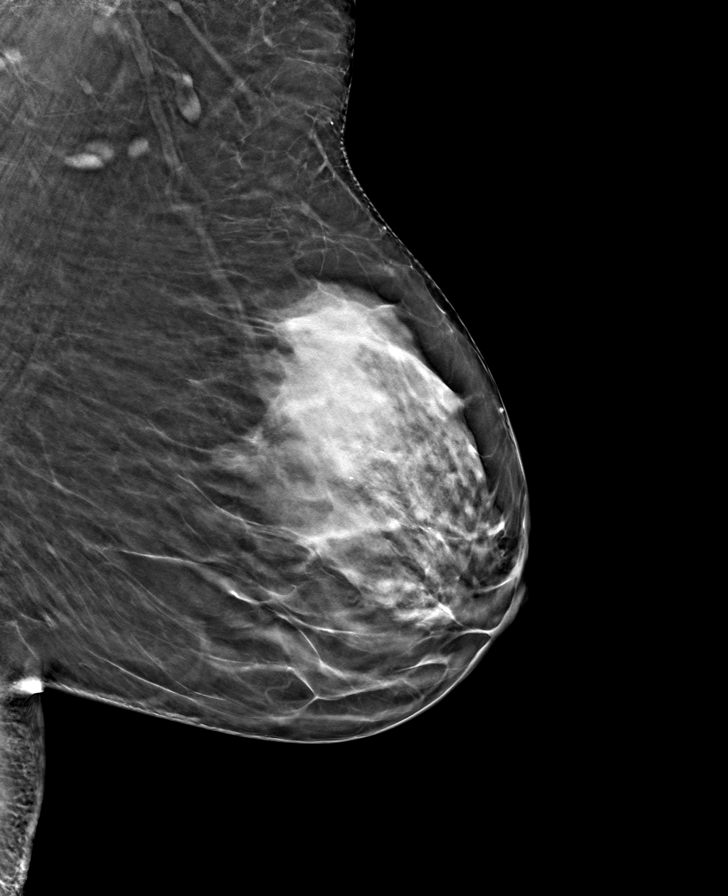

[R MLO tomo · tomo slice 33/64.0]
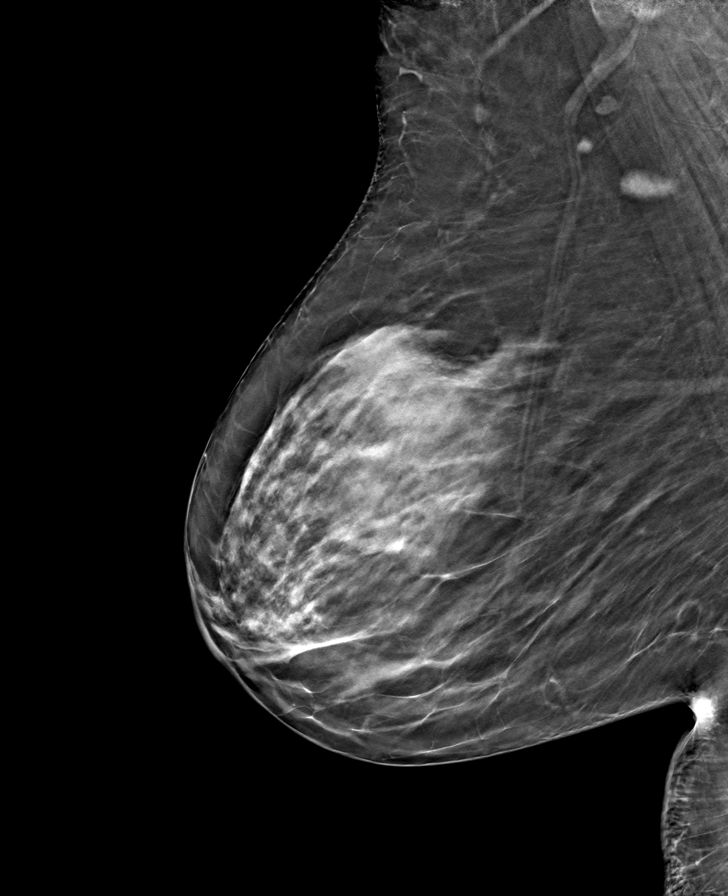

[L CC tomo · tomo slice 29/56.0]
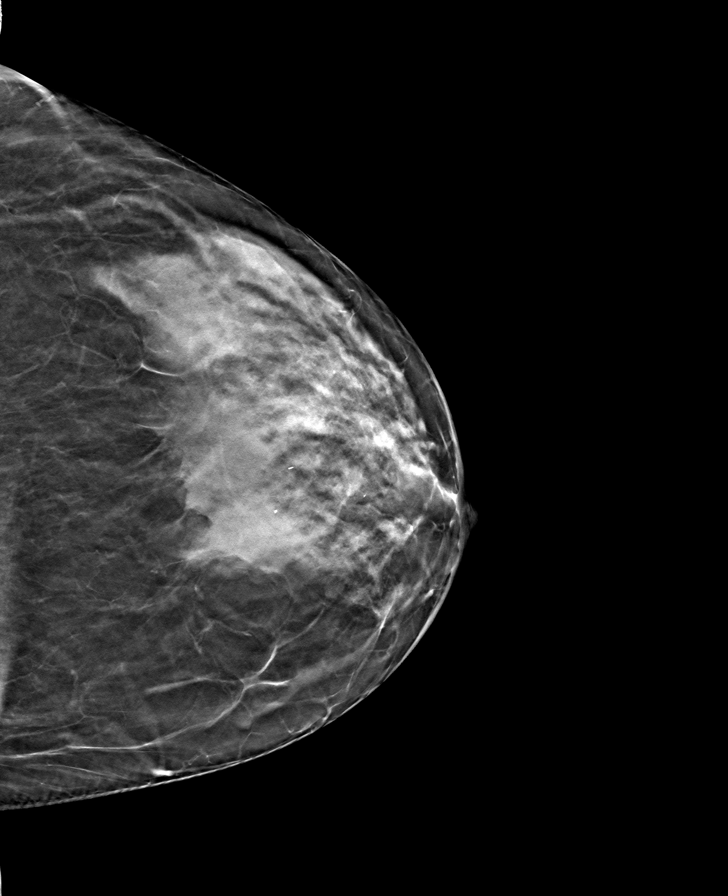

[8 of 24 positions shown; findings below may reference images not displayed]

ACR Breast Density Category c: The breast tissue is heterogeneously
dense, which may obscure small masses.
FINDINGS: There are no findings suspicious for malignancy.
IMPRESSION: No mammographic evidence of malignancy. A result letter of this
screening mammogram will be mailed directly to the patient.

RECOMMENDATION:
Screening mammogram in one year. (Code:Q3-W-BC3)

BI-RADS CATEGORY  1: Negative.

## 2022-07-06 IMAGING — CT CT HEART MORPH/PULM VEIN W/ CM & W/O CA SCORE
1 series · 4 of 6 positions shown, 5 images · non-contrast
Comparison: CTA of the chest, abdomen and pelvis 05/24/2019.
COMPARISON: CTA of the chest, abdomen and pelvis 05/24/2019.

Addendum:
EXAM:
OVER-READ INTERPRETATION  CT CHEST

The following report is an over-read performed by radiologist Dr.
Gaurang De La Garza [REDACTED] on 04/27/2021. This
over-read does not include interpretation of cardiac or coronary
anatomy or pathology. The coronary calcium score/coronary CTA
interpretation by the cardiologist is attached.
CLINICAL DATA: Atrial fibrillation scheduled for an ablation.
Cardiac CT/CTA
TECHNIQUE: The patient was scanned on a Siemens Somatom scanner.

[Series 755: pulmonary veins · 0.18mm/px · 4 of 6 slices shown, 5 images]
[im 2/6  vessel]
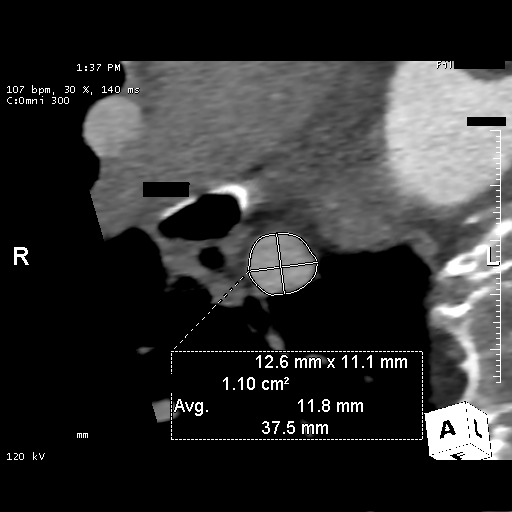
[im 2/6  lung]
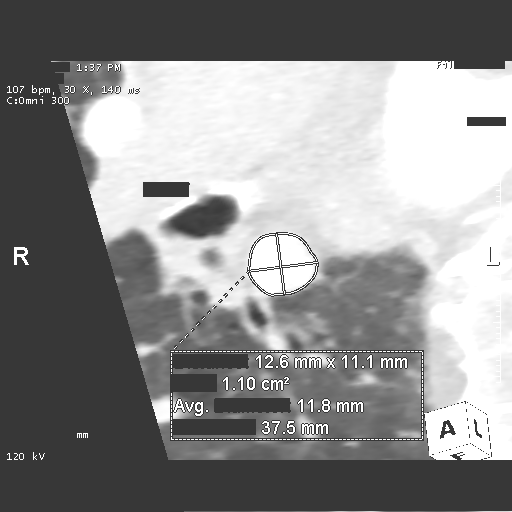
[im 3/6  vessel]
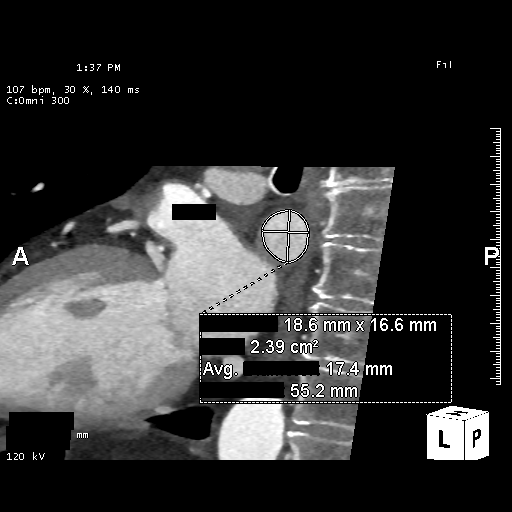
[im 4/6  vessel]
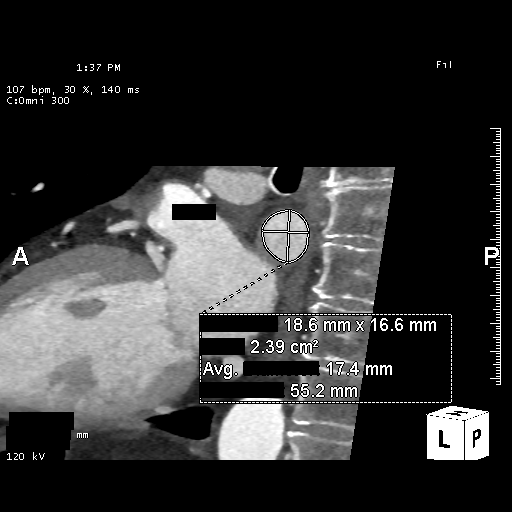
[im 5/6  vessel]
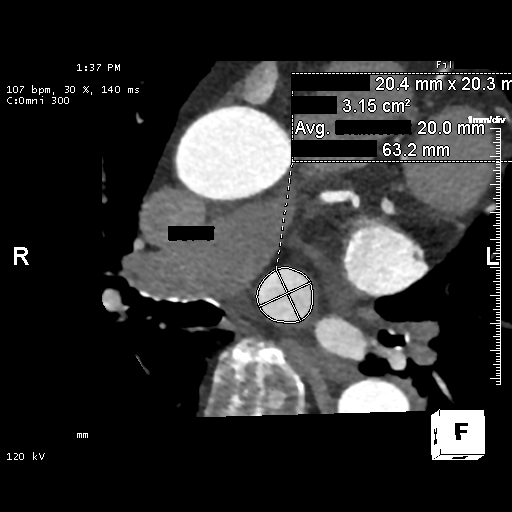

[4 of 6 positions shown; findings below may reference images not displayed]

FINDINGS: Atherosclerotic calcifications in the thoracic aorta. Within the
visualized portions of the thorax there are no suspicious appearing
pulmonary nodules or masses, there is no acute consolidative
airspace disease, no pleural effusions, no pneumothorax and no
lymphadenopathy. Moderate-sized hiatal hernia. Visualized portions
of the upper abdomen demonstrate a small cyst in segment 8 of the
liver, similar to prior study from 05/24/2019. There are no
aggressive appearing lytic or blastic lesions noted in the
visualized portions of the skeleton.
IMPRESSION: 1.  Aortic Atherosclerosis (6MW00-BNA.A).
2. Moderate-sized hiatal hernia.
FINDINGS: A 120 kV prospective scan was triggered in the descending thoracic
aorta at 111 HU's. Gantry rotation speed was 280 msecs and
collimation was .9 mm. No beta blockade and no NTG was given. The 3D
data set was reconstructed in 5% intervals of the R-R cycle. Phases
were analyzed on a dedicated work station using MPR, MIP and VRT
modes. The patient received 80 cc of contrast.

Left atrium: Mild enlargement. No thrombus in the left atrium or
left atrial appendage.

Left ventricle: Normal in size.

Right atrium: Mild enlargement

Right ventricle:  Normal in size.

Pericardium: Normal thickness

Pulmonary veins:

Left superior pulmonary vein: Measures 2.0cm x 2.0cm in diameter
with area 3.2cm^2.

Left inferior pulmonary vein: Measures 1.9cm x 1.7cm in diameter
with area 2.4cm^2.

Right superior pulmonary vein: Measures 2.0cm x 1.9cm in diameter
with area 2.9cm^2.

Right inferior pulmonary vein: Measures 1.3cm x 1.1cm in diameter
with area 1.1cm^2.

Pulmonary arteries: Normal size

Coronary Arteries: Calcium score 1 (24th percentile). Normal
coronary origin. Right dominance. The study was performed without
use of NTG and insufficient for plaque evaluation.

Esophagus: Courses posterior to left inferior pulmonary vein
IMPRESSION: 1. There is normal pulmonary vein drainage into the left atrium.
Measurements as reported

2. There is no thrombus in the left atrial appendage.

3. The esophagus courses posterior to ostium of left inferior
pulmonary vein

4. Coronary calcium score 1 (24th percentile).

*** End of Addendum ***
EXAM:
OVER-READ INTERPRETATION  CT CHEST

The following report is an over-read performed by radiologist Dr.
Gaurang De La Garza [REDACTED] on 04/27/2021. This
over-read does not include interpretation of cardiac or coronary
anatomy or pathology. The coronary calcium score/coronary CTA
interpretation by the cardiologist is attached.
FINDINGS: Atherosclerotic calcifications in the thoracic aorta. Within the
visualized portions of the thorax there are no suspicious appearing
pulmonary nodules or masses, there is no acute consolidative
airspace disease, no pleural effusions, no pneumothorax and no
lymphadenopathy. Moderate-sized hiatal hernia. Visualized portions
of the upper abdomen demonstrate a small cyst in segment 8 of the
liver, similar to prior study from 05/24/2019. There are no
aggressive appearing lytic or blastic lesions noted in the
visualized portions of the skeleton.
IMPRESSION: 1.  Aortic Atherosclerosis (6MW00-BNA.A).
2. Moderate-sized hiatal hernia.

## 2022-07-09 DIAGNOSIS — Z6832 Body mass index (BMI) 32.0-32.9, adult: Secondary | ICD-10-CM | POA: Diagnosis not present

## 2022-07-09 DIAGNOSIS — F3341 Major depressive disorder, recurrent, in partial remission: Secondary | ICD-10-CM | POA: Diagnosis not present

## 2022-07-09 DIAGNOSIS — D649 Anemia, unspecified: Secondary | ICD-10-CM | POA: Diagnosis not present

## 2022-07-09 DIAGNOSIS — I10 Essential (primary) hypertension: Secondary | ICD-10-CM | POA: Diagnosis not present

## 2022-07-09 DIAGNOSIS — F419 Anxiety disorder, unspecified: Secondary | ICD-10-CM | POA: Diagnosis not present

## 2022-07-09 DIAGNOSIS — R5383 Other fatigue: Secondary | ICD-10-CM | POA: Diagnosis not present

## 2022-07-09 DIAGNOSIS — K219 Gastro-esophageal reflux disease without esophagitis: Secondary | ICD-10-CM | POA: Diagnosis not present

## 2022-07-09 DIAGNOSIS — M159 Polyosteoarthritis, unspecified: Secondary | ICD-10-CM | POA: Diagnosis not present

## 2022-07-09 DIAGNOSIS — I4891 Unspecified atrial fibrillation: Secondary | ICD-10-CM | POA: Diagnosis not present

## 2022-07-18 ENCOUNTER — Other Ambulatory Visit: Payer: Self-pay | Admitting: Cardiology

## 2022-07-23 DIAGNOSIS — K219 Gastro-esophageal reflux disease without esophagitis: Secondary | ICD-10-CM | POA: Diagnosis not present

## 2022-07-23 DIAGNOSIS — F3341 Major depressive disorder, recurrent, in partial remission: Secondary | ICD-10-CM | POA: Diagnosis not present

## 2022-07-23 DIAGNOSIS — F419 Anxiety disorder, unspecified: Secondary | ICD-10-CM | POA: Diagnosis not present

## 2022-07-23 DIAGNOSIS — Z6833 Body mass index (BMI) 33.0-33.9, adult: Secondary | ICD-10-CM | POA: Diagnosis not present

## 2022-07-23 DIAGNOSIS — M25551 Pain in right hip: Secondary | ICD-10-CM | POA: Diagnosis not present

## 2022-07-23 DIAGNOSIS — M159 Polyosteoarthritis, unspecified: Secondary | ICD-10-CM | POA: Diagnosis not present

## 2022-07-23 DIAGNOSIS — I10 Essential (primary) hypertension: Secondary | ICD-10-CM | POA: Diagnosis not present

## 2022-07-23 DIAGNOSIS — I4891 Unspecified atrial fibrillation: Secondary | ICD-10-CM | POA: Diagnosis not present

## 2022-07-23 DIAGNOSIS — D649 Anemia, unspecified: Secondary | ICD-10-CM | POA: Diagnosis not present

## 2022-08-06 DIAGNOSIS — F3341 Major depressive disorder, recurrent, in partial remission: Secondary | ICD-10-CM | POA: Diagnosis not present

## 2022-08-06 DIAGNOSIS — M25552 Pain in left hip: Secondary | ICD-10-CM | POA: Diagnosis not present

## 2022-08-06 DIAGNOSIS — I4891 Unspecified atrial fibrillation: Secondary | ICD-10-CM | POA: Diagnosis not present

## 2022-08-06 DIAGNOSIS — D649 Anemia, unspecified: Secondary | ICD-10-CM | POA: Diagnosis not present

## 2022-08-06 DIAGNOSIS — F419 Anxiety disorder, unspecified: Secondary | ICD-10-CM | POA: Diagnosis not present

## 2022-08-06 DIAGNOSIS — I10 Essential (primary) hypertension: Secondary | ICD-10-CM | POA: Diagnosis not present

## 2022-08-06 DIAGNOSIS — Z6833 Body mass index (BMI) 33.0-33.9, adult: Secondary | ICD-10-CM | POA: Diagnosis not present

## 2022-08-06 DIAGNOSIS — K219 Gastro-esophageal reflux disease without esophagitis: Secondary | ICD-10-CM | POA: Diagnosis not present

## 2022-08-06 DIAGNOSIS — M159 Polyosteoarthritis, unspecified: Secondary | ICD-10-CM | POA: Diagnosis not present

## 2022-08-21 ENCOUNTER — Other Ambulatory Visit: Payer: Self-pay | Admitting: Cardiology

## 2022-08-23 NOTE — Telephone Encounter (Signed)
Refill to pharmacy 

## 2022-08-27 DIAGNOSIS — F419 Anxiety disorder, unspecified: Secondary | ICD-10-CM | POA: Diagnosis not present

## 2022-08-27 DIAGNOSIS — J208 Acute bronchitis due to other specified organisms: Secondary | ICD-10-CM | POA: Diagnosis not present

## 2022-08-27 DIAGNOSIS — M159 Polyosteoarthritis, unspecified: Secondary | ICD-10-CM | POA: Diagnosis not present

## 2022-08-27 DIAGNOSIS — D649 Anemia, unspecified: Secondary | ICD-10-CM | POA: Diagnosis not present

## 2022-08-27 DIAGNOSIS — I4891 Unspecified atrial fibrillation: Secondary | ICD-10-CM | POA: Diagnosis not present

## 2022-08-27 DIAGNOSIS — B9689 Other specified bacterial agents as the cause of diseases classified elsewhere: Secondary | ICD-10-CM | POA: Diagnosis not present

## 2022-08-27 DIAGNOSIS — I10 Essential (primary) hypertension: Secondary | ICD-10-CM | POA: Diagnosis not present

## 2022-08-27 DIAGNOSIS — F3341 Major depressive disorder, recurrent, in partial remission: Secondary | ICD-10-CM | POA: Diagnosis not present

## 2022-08-27 DIAGNOSIS — K219 Gastro-esophageal reflux disease without esophagitis: Secondary | ICD-10-CM | POA: Diagnosis not present

## 2022-09-06 DIAGNOSIS — I10 Essential (primary) hypertension: Secondary | ICD-10-CM | POA: Diagnosis not present

## 2022-09-06 DIAGNOSIS — B9689 Other specified bacterial agents as the cause of diseases classified elsewhere: Secondary | ICD-10-CM | POA: Diagnosis not present

## 2022-09-06 DIAGNOSIS — D649 Anemia, unspecified: Secondary | ICD-10-CM | POA: Diagnosis not present

## 2022-09-06 DIAGNOSIS — K219 Gastro-esophageal reflux disease without esophagitis: Secondary | ICD-10-CM | POA: Diagnosis not present

## 2022-09-06 DIAGNOSIS — F419 Anxiety disorder, unspecified: Secondary | ICD-10-CM | POA: Diagnosis not present

## 2022-09-06 DIAGNOSIS — I4891 Unspecified atrial fibrillation: Secondary | ICD-10-CM | POA: Diagnosis not present

## 2022-09-06 DIAGNOSIS — F3341 Major depressive disorder, recurrent, in partial remission: Secondary | ICD-10-CM | POA: Diagnosis not present

## 2022-09-06 DIAGNOSIS — M159 Polyosteoarthritis, unspecified: Secondary | ICD-10-CM | POA: Diagnosis not present

## 2022-09-06 DIAGNOSIS — J208 Acute bronchitis due to other specified organisms: Secondary | ICD-10-CM | POA: Diagnosis not present

## 2022-09-23 ENCOUNTER — Other Ambulatory Visit: Payer: Self-pay | Admitting: Cardiology

## 2022-10-14 ENCOUNTER — Other Ambulatory Visit: Payer: Self-pay | Admitting: Cardiology

## 2022-11-17 ENCOUNTER — Ambulatory Visit: Payer: Medicare PPO | Admitting: Cardiology

## 2022-11-18 ENCOUNTER — Encounter (HOSPITAL_COMMUNITY): Payer: Self-pay | Admitting: *Deleted

## 2023-01-17 ENCOUNTER — Ambulatory Visit: Payer: Medicare PPO | Admitting: Cardiology

## 2023-01-19 ENCOUNTER — Ambulatory Visit: Payer: Medicare PPO | Attending: Cardiology

## 2023-01-19 DIAGNOSIS — I480 Paroxysmal atrial fibrillation: Secondary | ICD-10-CM

## 2023-01-20 ENCOUNTER — Encounter: Payer: Self-pay | Admitting: Cardiology

## 2023-01-20 ENCOUNTER — Ambulatory Visit: Payer: Medicare PPO | Attending: Cardiology | Admitting: Cardiology

## 2023-01-20 VITALS — BP 140/80 | HR 65 | Ht 64.0 in | Wt 185.0 lb

## 2023-01-20 DIAGNOSIS — I10 Essential (primary) hypertension: Secondary | ICD-10-CM | POA: Diagnosis not present

## 2023-01-20 DIAGNOSIS — R42 Dizziness and giddiness: Secondary | ICD-10-CM | POA: Diagnosis not present

## 2023-01-20 DIAGNOSIS — E785 Hyperlipidemia, unspecified: Secondary | ICD-10-CM

## 2023-01-20 DIAGNOSIS — I480 Paroxysmal atrial fibrillation: Secondary | ICD-10-CM

## 2023-01-20 NOTE — Patient Instructions (Signed)
Medication Instructions:  Your physician recommends that you continue on your current medications as directed. Please refer to the Current Medication list given to you today.  *If you need a refill on your cardiac medications before your next appointment, please call your pharmacy*   Lab Work: None Ordered If you have labs (blood work) drawn today and your tests are completely normal, you will receive your results only by: MyChart Message (if you have MyChart) OR A paper copy in the mail If you have any lab test that is abnormal or we need to change your treatment, we will call you to review the results.   Testing/Procedures: Your physician has requested that you have an echocardiogram. Echocardiography is a painless test that uses sound waves to create images of your heart. It provides your doctor with information about the size and shape of your heart and how well your heart's chambers and valves are working. This procedure takes approximately one hour. There are no restrictions for this procedure. Please do NOT wear cologne, perfume, aftershave, or lotions (deodorant is allowed). Please arrive 15 minutes prior to your appointment time.  WHY IS MY DOCTOR PRESCRIBING ZIO? The Zio system is proven and trusted by physicians to detect and diagnose irregular heart rhythms -- and has been prescribed to hundreds of thousands of patients.  The FDA has cleared the Zio system to monitor for many different kinds of irregular heart rhythms. In a study, physicians were able to reach a diagnosis 90% of the time with the Zio system1.  You can wear the Zio monitor -- a small, discreet, comfortable patch -- during your normal day-to-day activity, including while you sleep, shower, and exercise, while it records every single heartbeat for analysis.  1Barrett, P., et al. Comparison of 24 Hour Holter Monitoring Versus 14 Day Novel Adhesive Patch Electrocardiographic Monitoring. American Journal of Medicine,  2014.  ZIO VS. HOLTER MONITORING The Zio monitor can be comfortably worn for up to 14 days. Holter monitors can be worn for 24 to 48 hours, limiting the time to record any irregular heart rhythms you may have. Zio is able to capture data for the 51% of patients who have their first symptom-triggered arrhythmia after 48 hours.1  LIVE WITHOUT RESTRICTIONS The Zio ambulatory cardiac monitor is a small, unobtrusive, and water-resistant patch--you might even forget you're wearing it. The Zio monitor records and stores every beat of your heart, whether you're sleeping, working out, or showering.         Follow-Up: At [facility], you and your health needs are our priority.  As part of our continuing mission to provide you with exceptional heart care, we have created designated Provider Care Teams.  These Care Teams include your primary Cardiologist (physician) and Advanced Practice Providers (APPs -  Physician Assistants and Nurse Practitioners) who all work together to provide you with the care you need, when you need it.  We recommend signing up for the patient portal called "MyChart".  Sign up information is provided on this After Visit Summary.  MyChart is used to connect with patients for Virtual Visits (Telemedicine).  Patients are able to view lab/test results, encounter notes, upcoming appointments, etc.  Non-urgent messages can be sent to your provider as well.   To learn more about what you can do with MyChart, go to [email or web address].    Your next appointment:   3 month(s)  The format for your next appointment:   In Person  Provider:   Gypsy Balsam, MD  Other Inst[facility] [address] [place], [place] [phone], [phone] Dr. Alben Deeds- Rheumatologist

## 2023-01-20 NOTE — Progress Notes (Signed)
Cardiology Office Note:    Date:  01/20/2023   ID:  Merdis Delay, DOB 1946-04-14, MRN [id number]  PCP:  Simone Curia, MD  Cardiologist:  Gypsy Balsam, MD    Referring MD: Alinda Deem, MD   Chief Complaint  Patient presents with   Follow-up    History of Present Illness:    Penny Hines is a 77 y.o. female with past medical history significant for paroxysmal atrial fibrillation, in 2022 she had atrial fibrillation ablation done, essential hypertension, rheumatoid arthritis not on any therapy, long-term anticoagulation.  Comes today to my office want to be reestablished as a patient to clinic.  Overall she says she is doing well but she thinks that her atrial fibrillation is coming back she reports to have a few episode of palpitations lasting sometimes up to few hours sometimes days.  She does have Kardia device but look like she never was able to record this arrhythmia.  During those episodes she feels fine there is no dizziness no chest pain no passing out.  Another concern is the fact that she does have a balance problem turning her head very quickly give her dizziness I suspect there is some component of vertigo.  She actually end up being in the emergency room once because of head trauma and luckily there was no intracranial bleed.  Obviously this very concerning  Past Medical History:  Diagnosis Date   Current use of long term anticoagulation 10/10/2017   Depression    Esophageal dilatation    Essential hypertension 10/10/2017   GERD (gastroesophageal reflux disease)    Hiatal hernia    Paroxysmal atrial fibrillation 10/10/2017   Rheumatoid arthritis     Past Surgical History:  Procedure Laterality Date   ATRIAL FIBRILLATION ABLATION N/A 04/29/2021   Procedure: ATRIAL FIBRILLATION ABLATION;  Surgeon: Regan Lemming, MD;  Location: [facility];  Service: Cardiovascular;  Laterality: N/A;   CESAREAN SECTION  1976   REPLACEMENT TOTAL KNEE Right 2008     Current Medications: Current Meds  Medication Sig   ALPRAZolam (XANAX) 0.5 MG tablet Take 0.5 mg by mouth daily as needed for anxiety.   ELIQUIS 5 MG TABS tablet Take 1 tablet (5 mg total) by mouth 2 (two) times daily.   flecainide (TAMBOCOR) 50 MG tablet Take 1 tablet (50 mg total) by mouth 2 (two) times daily.   ibuprofen (ADVIL) 200 MG tablet Take 400-600 mg by mouth every 8 (eight) hours as needed (for pain).   loratadine (CLARITIN) 10 MG tablet Take 10 mg by mouth as needed.   metoprolol succinate (TOPROL-XL) 50 MG 24 hr tablet TAKE 1 TABLET BY MOUTH EVERY MORNING AND ONE-HALF TABLET EVERY NIGHT. TAKE WITH OR IMMEDIATELY FOLLOWING A MEAL   nitroGLYCERIN (NITROSTAT) 0.4 MG SL tablet Place 0.4 mg under the tongue every 5 (five) minutes x 3 doses as needed for chest pain.   omeprazole (PRILOSEC) 20 MG capsule Take 20 mg by mouth in the morning.   sertraline (ZOLOFT) 100 MG tablet Take 100 mg by mouth in the morning.   traMADol (ULTRAM) 50 MG tablet Take 50 mg by mouth every 6 (six) hours as needed for moderate pain.     Allergies:   Patient has no known allergies.   Social History   Socioeconomic History   Marital status: Single    Spouse name: Not on file   Number of children: Not on file   Years of education: Not on file  Highest education level: Not on file  Occupational History   Not on file  Tobacco Use   Smoking status: Never   Smokeless tobacco: Never  Substance and Sexual Activity   Alcohol use: Yes    Comment: Occasional   Drug use: Not Currently   Sexual activity: Not on file  Other Topics Concern   Not on file  Social History Narrative   Not on file   Social Determinants of Health   Financial Resource Strain: Not on file  Food Insecurity: Not on file  Transportation Needs: Not on file  Physical Activity: Not on file  Stress: Not on file  Social Connections: Not on file     Family History: The patient's family history includes Diabetes in her  brother and sister; Heart disease in her father; Hypertension in her father and mother; Lung cancer in her brother; Rheum arthritis in her sister; Stroke in her maternal grandmother. There is no history of Breast cancer. ROS:   Please see the history of present illness.    All 14 point review of systems negative except as described per history of present illness  EKGs/Labs/Other Studies Reviewed:      Recent Labs: No results found for requested labs within last 365 days.  Recent Lipid Panel No results found for: "CHOL", "TRIG", "HDL", "CHOLHDL", "VLDL", "LDLCALC", "LDLDIRECT"  Physical Exam:    VS:  BP (!) 140/80 (BP Location: Right Arm, Patient Position: Sitting, Cuff Size: Normal)   Pulse 65   Ht 5\' 4"  (1.626 m)   Wt 185 lb (83.9 kg)   SpO2 98%   BMI 31.76 kg/m     Wt Readings from Last 3 Encounters:  01/20/23 185 lb (83.9 kg)  07/27/21 177 lb (80.3 kg)  06/03/21 180 lb (81.6 kg)     GEN:  Well nourished, well developed in no acute distress HEENT: Normal NECK: No JVD; No carotid bruits LYMPHATICS: No lymphadenopathy CARDIAC: RRR, no murmurs, no rubs, no gallops RESPIRATORY:  Clear to auscultation without rales, wheezing or rhonchi  ABDOMEN: Soft, non-tender, non-distended MUSCULOSKELETAL:  No edema; No deformity  SKIN: Warm and dry LOWER EXTREMITIES: no swelling NEUROLOGIC:  Alert and oriented x 3 PSYCHIATRIC:  Normal affect   ASSESSMENT:    1. Paroxysmal atrial fibrillation   2. Essential hypertension   3. Dyslipidemia    PLAN:    In order of problems listed above:  Paroxysmal atrial fibrillation.  Have asked her to wear Zio patch for 2 weeks to see if she have any recurrences of atrial fibrillation, in the meantime we will continue anticoagulation.  Another thing I will do is echocardiogram to check size of the left atrium based on that we will try to decide how frequent does episode of atrial fibrillation are if anything else need to be done which could be  antiarrhythmic therapy. Essential hypertension seems to well-controlled continue present management. Dyslipidemia I did review K PN which show me her LDL is 189 HDL 64 which is from 2022, will call primary care physician try to get a copy of her fasting lipid profile if there is known done recently that I will redo the test and treat appropriately. Frequent falls with some balance issue.  She will be referred to Dr. Marcheta Grammes here in town ENT trying to teach her some maneuvers to help with the vertigo.  If that will not be sufficient then she will be referred to our balance clinic in the hospital.  If she continues having falls  and those maneuvers will not help we will consider Watchman device. Rheumatoid arthritis: She will be referred to rheumatologist   Medication Adjustments/Labs and Tests Ordered: Current medicines are reviewed at length with the patient today.  Concerns regarding medicines are outlined above.  No orders of the defined types were placed in this encounter.  Medication changes: No orders of the defined types were placed in this encounter.   Signed, [name], MD, [facility] 01/20/2023 10:20 AM    [facility]

## 2023-02-02 ENCOUNTER — Ambulatory Visit: Payer: Medicare PPO | Attending: Cardiology

## 2023-02-02 DIAGNOSIS — I480 Paroxysmal atrial fibrillation: Secondary | ICD-10-CM

## 2023-02-02 LAB — ECHOCARDIOGRAM COMPLETE: S' Lateral: 3.3 cm

## 2023-02-04 ENCOUNTER — Telehealth: Payer: Self-pay

## 2023-02-04 NOTE — Telephone Encounter (Signed)
Unable to reach or LM ?

## 2023-02-04 NOTE — Telephone Encounter (Signed)
-----   Message from [name], MD sent at 02/02/2023  8:27 PM EDT ----- Echocardiogram showed preserved left ventricle ejection fraction, overall looks good

## 2023-02-11 ENCOUNTER — Telehealth: Payer: Self-pay | Admitting: Cardiology

## 2023-02-11 NOTE — Telephone Encounter (Signed)
Results reviewed with pt as per Dr. Krasowski's note.  Pt verbalized understanding and had no additional questions. Routed to PCP  

## 2023-02-11 NOTE — Telephone Encounter (Signed)
Patient is returning call about Echo results. 

## 2023-03-04 ENCOUNTER — Telehealth: Payer: Self-pay

## 2023-03-04 DIAGNOSIS — I480 Paroxysmal atrial fibrillation: Secondary | ICD-10-CM

## 2023-03-04 NOTE — Telephone Encounter (Signed)
-----   Message from Penny Lea, MD sent at 03/02/2023  9:08 AM EDT ----- Patient still have recurrences of atrial fibrillation, likely burden is only 3%, please send a copy of it to our EP team

## 2023-03-04 NOTE — Telephone Encounter (Signed)
Patient notified of results. Aware she'll be contacted by [facility] team for an appt.

## 2023-03-14 ENCOUNTER — Encounter: Payer: Self-pay | Admitting: Cardiology

## 2023-03-14 ENCOUNTER — Ambulatory Visit: Payer: Medicare PPO | Attending: Cardiology | Admitting: Cardiology

## 2023-03-14 VITALS — BP 112/78 | HR 67 | Ht 64.0 in | Wt 183.2 lb

## 2023-03-14 DIAGNOSIS — I480 Paroxysmal atrial fibrillation: Secondary | ICD-10-CM | POA: Diagnosis not present

## 2023-03-14 DIAGNOSIS — D6869 Other thrombophilia: Secondary | ICD-10-CM | POA: Diagnosis not present

## 2023-03-14 DIAGNOSIS — I10 Essential (primary) hypertension: Secondary | ICD-10-CM | POA: Diagnosis not present

## 2023-03-14 MED ORDER — DILTIAZEM HCL ER COATED BEADS 180 MG PO CP24: 180.0000 mg | ORAL_CAPSULE | Freq: Every day | ORAL | 3 refills | Status: DC

## 2023-03-14 MED ORDER — FLECAINIDE ACETATE 50 MG PO TABS: 50.0000 mg | ORAL_TABLET | Freq: Two times a day (BID) | ORAL | 3 refills | Status: DC

## 2023-03-14 NOTE — Patient Instructions (Addendum)
Medication Instructions:  Your physician has recommended you make the following change in your medication:  1) STOP taking metoprolol  2) START taking diltiazem 180 mg daily  3) RESTART flecainide 50 mg twice daily  *If you need a refill on your cardiac medications before your next appointment, please call your pharmacy*  Lab Work: BMET and CBC prior to your ablation  Testing/Procedures: Your physician has recommended that you have an ablation. Catheter ablation is a medical procedure used to treat some cardiac arrhythmias (irregular heartbeats). During catheter ablation, a long, thin, flexible tube is put into a blood vessel in your groin (upper thigh), or neck. This tube is called an ablation catheter. It is then guided to your heart through the blood vessel. Radio frequency waves destroy small areas of heart tissue where abnormal heartbeats may cause an arrhythmia to start. Marland Kitchen You are scheduled for Atrial Fibrillation Ablation on Monday, October 21 with Dr. Loman Brooklyn.Please arrive at the Main Entrance A at [facility]: [address] [place], [place] [address] at 8:30 AM    Follow-Up: At [facility], you and your health needs are our priority.  As part of our continuing mission to provide you with exceptional heart care, we have created designated Provider Care Teams.  These Care Teams include your primary Cardiologist (physician) and Advanced Practice Providers (APPs -  Physician Assistants and Nurse Practitioners) who all work together to provide you with the care you need, when you need it.  Your next appointment:   We will call you to arrange follow up appointments.

## 2023-03-14 NOTE — Progress Notes (Signed)
Electrophysiology Office Note   Date:  03/14/2023   ID:  Penny Hines, DOB [birth year], MRN [id number]  PCP:  Simone Curia, MD  Cardiologist:  [place] Primary Electrophysiologist:  Skylur Fuston [name], MD    Chief Complaint: AF   History of Present Illness: Penny Hines is a 77 y.o. female who is being seen today for the evaluation of AF at the request of [name], MD. Presenting today for electrophysiology evaluation.  She has a history seen for hypertension, rheumatoid arthritis, hyperlipidemia, obesity, atrial fibrillation.  She feels fatigue and lethargy as well as short of breath with atrial fibrillation.  She is currently on Eliquis and metoprolol.  She is post atrial fibrillation ablation 04/29/2021.  Post ablation, flecainide was stopped.  She recently wore a cardiac monitor that showed a 3% atrial fibrillation burden.  Today, denies symptoms of palpitations, chest pain, shortness of breath, orthopnea, PND, lower extremity edema, claudication, dizziness, presyncope, syncope, bleeding, or neurologic sequela. The patient is tolerating medications without difficulties.  For the most part she feels well.  She does have a few episodes of palpitations and shortness of breath.  She wore a cardiac monitor that showed a 3% atrial fibrillation burden.  At this point, she would prefer rhythm control over watchful waiting.  She also notes that she has been losing her hair.    Past Medical History:  Diagnosis Date   Current use of long term anticoagulation 10/10/2017   Depression    Esophageal dilatation    Essential hypertension 10/10/2017   GERD (gastroesophageal reflux disease)    Hiatal hernia    Paroxysmal atrial fibrillation (HCC) 10/10/2017   Rheumatoid arthritis (HCC)    Past Surgical History:  Procedure Laterality Date   ATRIAL FIBRILLATION ABLATION N/A 04/29/2021   Procedure: ATRIAL FIBRILLATION ABLATION;  Surgeon: Regan Lemming, MD;  Location: [facility];  Service: Cardiovascular;  Laterality: N/A;   CESAREAN SECTION  1976   REPLACEMENT TOTAL KNEE Right 2008     Current Outpatient Medications  Medication Sig Dispense Refill   ALPRAZolam (XANAX) 0.5 MG tablet Take 0.5 mg by mouth daily as needed for anxiety.     clonazePAM (KLONOPIN PO) Take 1 tablet by mouth as needed (take as directed).     diltiazem (CARDIZEM CD) 180 MG 24 hr capsule Take 1 capsule (180 mg total) by mouth daily. 90 capsule 3   ELIQUIS 5 MG TABS tablet Take 1 tablet (5 mg total) by mouth 2 (two) times daily. 60 tablet 12   ibuprofen (ADVIL) 200 MG tablet Take 400-600 mg by mouth every 8 (eight) hours as needed (for pain).     loratadine (CLARITIN) 10 MG tablet Take 10 mg by mouth as needed.     nitroGLYCERIN (NITROSTAT) 0.4 MG SL tablet Place 0.4 mg under the tongue every 5 (five) minutes x 3 doses as needed for chest pain.     omeprazole (PRILOSEC) 40 MG capsule Take 40 mg by mouth daily.     sertraline (ZOLOFT) 100 MG tablet Take 100 mg by mouth in the morning.     traMADol (ULTRAM) 50 MG tablet Take 50 mg by mouth every 6 (six) hours as needed for moderate pain.     flecainide (TAMBOCOR) 50 MG tablet Take 1 tablet (50 mg total) by mouth 2 (two) times daily. 60 tablet 3   No current facility-administered medications for this visit.    Allergies:   Patient has no known allergies.  Social History:  The patient  reports that she has never smoked. She has never used smokeless tobacco. She reports current alcohol use. She reports that she does not currently use drugs.   Family History:  The patient's family history includes Diabetes in her brother and sister; Heart disease in her father; Hypertension in her father and mother; Lung cancer in her brother; Rheum arthritis in her sister; Stroke in her maternal grandmother.   ROS:  Please see the history of present illness.   Otherwise, review of systems is positive for none.   All other systems are reviewed and negative.    PHYSICAL EXAM: VS:  BP 112/78   Pulse 67   Ht 5\' 4"  (1.626 m)   Wt 183 lb 3.2 oz (83.1 kg)   SpO2 97%   BMI 31.45 kg/m  , BMI Body mass index is 31.45 kg/m. GEN: Well nourished, well developed, in no acute distress  HEENT: normal  Neck: no JVD, carotid bruits, or masses Cardiac: RRR; no murmurs, rubs, or gallops,no edema  Respiratory:  clear to auscultation bilaterally, normal work of breathing GI: soft, nontender, nondistended, + BS MS: no deformity or atrophy  Skin: warm and dry Neuro:  Strength and sensation are intact Psych: euthymic mood, full affect  EKG:  EKG is ordered today. Personal review of the ekg ordered shows sinus rhythm   Recent Labs: No results found for requested labs within last 365 days.    Lipid Panel  No results found for: "CHOL", "TRIG", "HDL", "CHOLHDL", "VLDL", "LDLCALC", "LDLDIRECT"   Wt Readings from Last 3 Encounters:  03/14/23 183 lb 3.2 oz (83.1 kg)  01/20/23 185 lb (83.9 kg)  07/27/21 177 lb (80.3 kg)      Other studies Reviewed: Additional studies/ records that were reviewed today include: TTE 02/02/23 Review of the above records today demonstrates:   1. Left ventricular ejection fraction, by estimation, is 60 to 65%. The  left ventricle has normal function. The left ventricle has no regional  wall motion abnormalities. Left ventricular diastolic parameters are  consistent with Grade I diastolic  dysfunction (impaired relaxation). GLS-16.1%   2. Right ventricular systolic function is normal. The right ventricular  size is normal. There is normal pulmonary artery systolic pressure.   3. The mitral valve is normal in structure. No evidence of mitral valve  regurgitation. No evidence of mitral stenosis.   4. The aortic valve is normal in structure. Aortic valve regurgitation is  not visualized. No aortic stenosis is present.   5. The inferior vena cava is normal in size with greater than 50%  respiratory variability, suggesting  right atrial pressure of 3 mmHg.   Cardiac monitor 02/27/2023 personally reviewed 1 episode of ventricular tachycardia 4 beats at rate of 102. 7 episode of supraventricular tachycardia, longest episode 5 beats. Atrial fibrillation noted total burden 3% average heart rate 138 between 69 and 181.  Symptomatic  ASSESSMENT AND PLAN:  1.  Persistent atrial fibrillation: Currently on Toprol-XL, Eliquis.  CHA2DS2-VASc of 4.  Post ablation 04/29/2021.  She is unfortunately had more frequent episodes of atrial fibrillation.  She has been having quite a bit of pain from her rheumatoid arthritis.  Cherry Turlington restart her flecainide 50 mg twice daily.  She feels that she is losing her hair.  Meenakshi Sazama stop metoprolol and start diltiazem 180 mg daily.  Taeja Debellis also plan for repeat ablation.  Risk, benefits, and alternatives to EP study and radiofrequency ablation for afib were also discussed in detail  today. These risks include but are not limited to stroke, bleeding, vascular damage, tamponade, perforation, damage to the esophagus, lungs, and other structures, pulmonary vein stenosis, worsening renal function, and death. The patient understands these risk and wishes to proceed.  We Brennyn Ortlieb therefore proceed with catheter ablation at the next available time.  Carto, ICE, anesthesia are requested for the procedure.  Gissell Barra also obtain CT PV protocol prior to the procedure to exclude LAA thrombus and further evaluate atrial anatomy.   2.  Hypertension: Currently well-controlled  3.  Secondary hypercoagulable state: Currently on Eliquis for atrial fibrillation   Current medicines are reviewed at length with the patient today.   The patient does not have concerns regarding her medicines.  The following changes were made today: Start flecainide  Labs/ tests ordered today include:  Orders Placed This Encounter  Procedures   CT CARDIAC MORPH/PULM VEIN W/CM&W/O CA SCORE   Basic metabolic panel   CBC   EKG 12-Lead       Disposition:   FU 6 months  Signed, Harue Pribble [name], MD  03/14/2023 10:28 AM     [facility] [address] [place] [address] [phone] (office) [phone] (fax)

## 2023-04-07 ENCOUNTER — Other Ambulatory Visit: Payer: Self-pay | Admitting: Cardiology

## 2023-04-24 ENCOUNTER — Encounter (HOSPITAL_COMMUNITY): Payer: Self-pay

## 2023-04-24 ENCOUNTER — Inpatient Hospital Stay: Admit: 2023-04-24 | Payer: Medicare PPO | Admitting: Internal Medicine

## 2023-04-24 DIAGNOSIS — R0789 Other chest pain: Secondary | ICD-10-CM | POA: Diagnosis not present

## 2023-04-24 DIAGNOSIS — I4891 Unspecified atrial fibrillation: Secondary | ICD-10-CM | POA: Diagnosis not present

## 2023-04-24 DIAGNOSIS — I10 Essential (primary) hypertension: Secondary | ICD-10-CM | POA: Diagnosis not present

## 2023-04-25 ENCOUNTER — Ambulatory Visit: Payer: Medicare PPO | Admitting: Cardiology

## 2023-04-25 DIAGNOSIS — I340 Nonrheumatic mitral (valve) insufficiency: Secondary | ICD-10-CM | POA: Diagnosis not present

## 2023-04-25 DIAGNOSIS — I10 Essential (primary) hypertension: Secondary | ICD-10-CM | POA: Diagnosis not present

## 2023-04-25 DIAGNOSIS — R0789 Other chest pain: Secondary | ICD-10-CM | POA: Diagnosis not present

## 2023-04-25 DIAGNOSIS — I4891 Unspecified atrial fibrillation: Secondary | ICD-10-CM | POA: Diagnosis not present

## 2023-04-26 DIAGNOSIS — I10 Essential (primary) hypertension: Secondary | ICD-10-CM | POA: Diagnosis not present

## 2023-04-26 DIAGNOSIS — R0789 Other chest pain: Secondary | ICD-10-CM | POA: Diagnosis not present

## 2023-04-26 DIAGNOSIS — I4891 Unspecified atrial fibrillation: Secondary | ICD-10-CM | POA: Diagnosis not present

## 2023-04-27 DIAGNOSIS — Z7901 Long term (current) use of anticoagulants: Secondary | ICD-10-CM | POA: Diagnosis not present

## 2023-04-27 DIAGNOSIS — I480 Paroxysmal atrial fibrillation: Secondary | ICD-10-CM | POA: Diagnosis not present

## 2023-04-27 DIAGNOSIS — R001 Bradycardia, unspecified: Secondary | ICD-10-CM | POA: Diagnosis not present

## 2023-04-27 DIAGNOSIS — R0789 Other chest pain: Secondary | ICD-10-CM | POA: Diagnosis not present

## 2023-04-27 DIAGNOSIS — I10 Essential (primary) hypertension: Secondary | ICD-10-CM | POA: Diagnosis not present

## 2023-05-11 ENCOUNTER — Encounter: Payer: Self-pay | Admitting: Internal Medicine

## 2023-05-27 ENCOUNTER — Encounter: Payer: Self-pay | Admitting: Cardiology

## 2023-05-27 ENCOUNTER — Ambulatory Visit: Payer: Medicare PPO | Admitting: Cardiology

## 2023-05-27 VITALS — BP 104/78 | HR 92 | Ht 64.0 in | Wt 183.0 lb

## 2023-05-27 DIAGNOSIS — I480 Paroxysmal atrial fibrillation: Secondary | ICD-10-CM | POA: Diagnosis not present

## 2023-05-27 DIAGNOSIS — Z79899 Other long term (current) drug therapy: Secondary | ICD-10-CM | POA: Diagnosis not present

## 2023-05-27 DIAGNOSIS — R0600 Dyspnea, unspecified: Secondary | ICD-10-CM

## 2023-05-27 DIAGNOSIS — I10 Essential (primary) hypertension: Secondary | ICD-10-CM

## 2023-05-27 DIAGNOSIS — E785 Hyperlipidemia, unspecified: Secondary | ICD-10-CM | POA: Diagnosis not present

## 2023-05-27 MED ORDER — AMIODARONE HCL 200 MG PO TABS: 100.0000 mg | ORAL_TABLET | Freq: Every day | ORAL | 3 refills | Status: AC

## 2023-05-27 NOTE — Progress Notes (Signed)
 " Cardiology Office Note:  .   Date:  05/30/2023  ID:  Consuelo GORMAN Getting, DOB 1946/09/20, MRN [id number] PCP: Jama Chow, MD  [facility] Providers Cardiologist:  Dub Huntsman, DO Electrophysiologist:  Will Gladis Norton, MD    History of Present Illness: .   ZYA FINKLE is a 77 y.o. female with a past medical history of PAF s/p ablation 2022, hypertension, hiatal hernia, GERD, RA, dyslipidemia.  Evaluated by Dr. Norton on 03/14/2023, she was bothered by fatigue and lethargy related to her atrial fibrillation.  She had an ablation on 04/29/2021, her flecainide  was stopped, Recently had worn a monitor which revealed 3% atrial fibrillation burden.  Her flecainide  was restarted, her metoprolol  was stopped and she was started on diltiazem  secondary to hair loss, plans for repeat ablation.  She was admitted to [facility] on 04/24/2023 with atrial fibrillation and slow ventricular rate, hypertension, her Cardizem  was held and she was started on isoproterenol.  Her hospitalization was complicated by visual hallucinations which had apparently been occurring for some time, psych was consulted and recommended to start low-dose Zyprexa, her flecainide  was ultimately stopped and she was started on amiodarone  200 mg daily with plans to taper down to 100 mg after 2 weeks.  She had a Lexiscan  that was negative for ischemia, troponins were negative x 3.  Labs on day of discharge potassium 4.3, sodium 134, creatinine 0.80, stable H&H 11.6 and 35.4.  Echo on 04/25/2023 EF 60 to 65%, mild to moderate MR.  Presents today accompanied by a friend for follow up after her recent hospitalization. Maintaining sinus rhythm. Some complaints with DOE.  There were some hallucinations that occurred while she was hospitalized, someone mention the pacemaker to her and she is somewhat fixated on this, discussed that she had an episode of bradycardia and that medications were stopped, no indications for that at this time.  She  is questioning whether she needs to have this upcoming ablation, she has a lot of questions and feels it may not be necessary.  She denies chest pain, palpitations, dyspnea, pnd, orthopnea, n, v, dizziness, syncope, edema, weight gain, or early satiety.   ROS: Review of Systems  Respiratory:  Positive for shortness of breath (wiht exertion).   All other systems reviewed and are negative.    Studies Reviewed: .        Cardiac Studies & Procedures     STRESS TESTS  MYOCARDIAL PERFUSION IMAGING 07/02/2020  Narrative  The left ventricular ejection fraction is hyperdynamic (>65%).  Nuclear stress EF: 67%.  There was no ST segment deviation noted during stress.  No T wave inversion was noted during stress.  The study is normal.  This is a low risk study.   ECHOCARDIOGRAM  ECHOCARDIOGRAM COMPLETE 02/02/2023  Narrative ECHOCARDIOGRAM REPORT    Patient Name:   NAKSHATRA KLOSE Date of Exam: 02/02/2023 Medical Rec #:  [id number]      Height:       64.0 in Accession #:    [id number]     Weight:       185.0 lb Date of Birth:  August 31, 1946      BSA:          1.893 m Patient Age:    76 years       BP:           140/80 mmHg Patient Gender: F              HR:  69 bpm. Exam Location:  [facility]  Procedure: 2D Echo, Cardiac Doppler, Color Doppler and Strain Analysis  Indications:    Dyspnea R06.00  History:        Patient has no prior history of Echocardiogram examinations. Arrythmias:Atrial Fibrillation, Signs/Symptoms:Dizziness/Lightheadedness; Risk Factors:Hypertension and Dyslipidemia.  Sonographer:    Lynwood Silvas RDCS Referring Phys: [phone] LAMAR PARAS KRASOWSKI  IMPRESSIONS   1. Left ventricular ejection fraction, by estimation, is 60 to 65%. The left ventricle has normal function. The left ventricle has no regional wall motion abnormalities. Left ventricular diastolic parameters are consistent with Grade I diastolic dysfunction (impaired relaxation). GLS-16.1% 2.  Right ventricular systolic function is normal. The right ventricular size is normal. There is normal pulmonary artery systolic pressure. 3. The mitral valve is normal in structure. No evidence of mitral valve regurgitation. No evidence of mitral stenosis. 4. The aortic valve is normal in structure. Aortic valve regurgitation is not visualized. No aortic stenosis is present. 5. The inferior vena cava is normal in size with greater than 50% respiratory variability, suggesting right atrial pressure of 3 mmHg.  FINDINGS Left Ventricle: Left ventricular ejection fraction, by estimation, is 60 to 65%. The left ventricle has normal function. The left ventricle has no regional wall motion abnormalities. The left ventricular internal cavity size was normal in size. There is no left ventricular hypertrophy. Left ventricular diastolic parameters are consistent with Grade I diastolic dysfunction (impaired relaxation).  Right Ventricle: The right ventricular size is normal. No increase in right ventricular wall thickness. Right ventricular systolic function is normal. There is normal pulmonary artery systolic pressure. The tricuspid regurgitant velocity is 1.71 m/s, and with an assumed right atrial pressure of 3 mmHg, the estimated right ventricular systolic pressure is 14.7 mmHg.  Left Atrium: Left atrial size was normal in size.  Right Atrium: Right atrial size was normal in size.  Pericardium: There is no evidence of pericardial effusion.  Mitral Valve: The mitral valve is normal in structure. No evidence of mitral valve regurgitation. No evidence of mitral valve stenosis.  Tricuspid Valve: The tricuspid valve is normal in structure. Tricuspid valve regurgitation is not demonstrated. No evidence of tricuspid stenosis.  Aortic Valve: The aortic valve is normal in structure. Aortic valve regurgitation is not visualized. No aortic stenosis is present.  Pulmonic Valve: The pulmonic valve was normal in  structure. Pulmonic valve regurgitation is not visualized. No evidence of pulmonic stenosis.  Aorta: The aortic root is normal in size and structure.  Venous: The inferior vena cava is normal in size with greater than 50% respiratory variability, suggesting right atrial pressure of 3 mmHg.  IAS/Shunts: No atrial level shunt detected by color flow Doppler.   LEFT VENTRICLE PLAX 2D LVIDd:         4.60 cm   Diastology LVIDs:         3.30 cm   LV e' medial:    5.87 cm/s LV PW:         0.90 cm   LV E/e' medial:  11.8 LV IVS:        0.90 cm   LV e' lateral:   7.62 cm/s LVOT diam:     2.00 cm   LV E/e' lateral: 9.1 LV SV:         70 LV SV Index:   37 LVOT Area:     3.14 cm   RIGHT VENTRICLE             IVC RV Basal diam:  2.80 cm     IVC diam: 1.70 cm RV S prime:     12.00 cm/s TAPSE (M-mode): 1.8 cm  LEFT ATRIUM             Index        RIGHT ATRIUM           Index LA diam:        3.80 cm 2.01 cm/m   RA Area:     12.70 cm LA Vol (A2C):   42.1 ml 22.24 ml/m  RA Volume:   29.10 ml  15.38 ml/m LA Vol (A4C):   50.9 ml 26.89 ml/m LA Biplane Vol: 47.6 ml 25.15 ml/m AORTIC VALVE LVOT Vmax:   105.00 cm/s LVOT Vmean:  66.000 cm/s LVOT VTI:    0.222 m  AORTA Ao Root diam: 2.90 cm Ao Desc diam: 2.00 cm  MV E velocity: 69.20 cm/s  TRICUSPID VALVE MV A velocity: 76.50 cm/s  TR Peak grad:   11.7 mmHg MV E/A ratio:  0.90        TR Vmax:        171.00 cm/s  SHUNTS Systemic VTI:  0.22 m Systemic Diam: 2.00 cm  Ronya Gilcrest Crape MD Electronically signed by Theodore Virgin Crape MD Signature Date/Time: 02/02/2023/12:53:07 PM    Final    MONITORS  LONG TERM MONITOR (3-14 DAYS) 02/15/2023  Narrative Patch Wear Time:  12 days and 23 hours (2024-04-11T10:38:39-0400 to 2024-04-24T10:21:14-0400)  Patient had a min HR of 51 bpm, max HR of 181 bpm, and avg HR of 65 bpm. Predominant underlying rhythm was Sinus Rhythm. 1 run of Ventricular Tachycardia occurred lasting 4 beats with a max rate of  102 bpm (avg 100 bpm). 7 Supraventricular Tachycardia runs occurred, the run with the fastest interval lasting 5 beats with a max rate of 176 bpm, the longest lasting 5 beats with an avg rate of 107 bpm. Atrial Fibrillation occurred (3% burden), ranging from 69-181 bpm (avg of 138 bpm), the longest lasting 8 hours 31 mins with an avg rate of 138 bpm. Atrial Fibrillation was detected within +/- 45 seconds of symptomatic patient event(s). Isolated SVEs were rare (<1.0%), SVE Couplets were rare (<1.0%), and SVE Triplets were rare (<1.0%). Isolated VEs were rare (<1.0%, 6710), VE Couplets were rare (<1.0%, 2), and VE Triplets were rare (<1.0%, 1). Ventricular Bigeminy and Trigeminy were present.  Summary and conclusions: 1 episode of ventricular tachycardia 4 beats at rate of 102. 7 episode of supraventricular tachycardia, longest episode 5 beats. Atrial fibrillation noted total burden 3% average heart rate 138 between 69 and 181.  Symptomatic           Risk Assessment/Calculations:    CHA2DS2-VASc Score = 4   This indicates a 4.8% annual risk of stroke. The patient's score is based upon: CHF History: 0 HTN History: 1 Diabetes History: 0 Stroke History: 0 Vascular Disease History: 0 Age Score: 2 Gender Score: 1  Physical Exam:   VS:  BP 104/78 (BP Location: Right Arm, Patient Position: Sitting, Cuff Size: Normal)   Pulse 92   Ht 5' 4 (1.626 m)   Wt 183 lb (83 kg)   SpO2 94%   BMI 31.41 kg/m    Wt Readings from Last 3 Encounters:  05/27/23 183 lb (83 kg)  03/14/23 183 lb 3.2 oz (83.1 kg)  01/20/23 185 lb (83.9 kg)    GEN: Well nourished, well developed in no acute distress NECK: No JVD; No carotid bruits CARDIAC: RRR, no  murmurs, rubs, gallops RESPIRATORY:  Clear to auscultation without rales, wheezing or rhonchi  ABDOMEN: Soft, non-tender, non-distended EXTREMITIES:  No edema; No deformity   ASSESSMENT AND PLAN: .   Paroxysmal atrial fibrillation/hypercoagulable state-she is  maintaining sinus rhythm today, CHA2DS2-VASc score of 4, continue Eliquis  5 mg twice daily--no indication for dose reduction, continue amiodarone  but we will decrease her dose to 100 mg daily.  She had a scheduled ablation for October however she would like to meet with Dr. Inocencio again to rediscuss this as she has more questions on whether this is necessary or not.  Will repeat TSH and LFTs today since she is on amiodarone  therapy. DOE-this has been ongoing for from some time, does not appear any worse than normal, will check proBNP Dyslipidemia-this appears to be managed by her PCP, currently not on any lipid-lowering agents. Hypertension-blood pressure is well-controlled at 104/78, not currently on any antihypertensives secondary to self blood pressure readings while she was in the hospital.  She will continue to monitor blood pressure at home, if her blood pressure elevates will need to consider starting her on antihypertensive.       Dispo: Decrease amiodarone  to 100 mg daily, TSH, LFTs and proBNP.  Schedule appointment with the EP.  Signed, Delon JAYSON Hoover, NP  "

## 2023-05-27 NOTE — Patient Instructions (Signed)
Medication Instructions:  Your physician has recommended you make the following change in your medication:  Decrease Amiodarone 100 mg once daily  *If you need a refill on your cardiac medications before your next appointment, please call your pharmacy*   Lab Work: Your physician recommends that you return for lab work in: Today for TSH and Liver Function  If you have labs (blood work) drawn today and your tests are completely normal, you will receive your results only by: MyChart Message (if you have MyChart) OR A paper copy in the mail If you have any lab test that is abnormal or we need to change your treatment, we will call you to review the results.   Testing/Procedures: NONE   Follow-Up: At [facility], you and your health needs are our priority.  As part of our continuing mission to provide you with exceptional heart care, we have created designated Provider Care Teams.  These Care Teams include your primary Cardiologist (physician) and Advanced Practice Providers (APPs -  Physician Assistants and Nurse Practitioners) who all work together to provide you with the care you need, when you need it.  We recommend signing up for the patient portal called "MyChart".  Sign up information is provided on this After Visit Summary.  MyChart is used to connect with patients for Virtual Visits (Telemedicine).  Patients are able to view lab/test results, encounter notes, upcoming appointments, etc.  Non-urgent messages can be sent to your provider as well.   To learn more about what you can do with MyChart, go to [email or web address].    Your next appointment:  As soon as possible with Dr. Alvin Critchley  Other Instructions

## 2023-05-28 LAB — HEPATIC FUNCTION PANEL
ALT: 13 IU/L (ref 0–32)
AST: 15 IU/L (ref 0–40)
Albumin: 3.9 g/dL (ref 3.8–4.8)
Alkaline Phosphatase: 51 IU/L (ref 44–121)
Bilirubin Total: 0.3 mg/dL (ref 0.0–1.2)
Bilirubin, Direct: <0.1 mg/dL (ref 0.00–0.40)
Total Protein: 6.2 g/dL (ref 6.0–8.5)

## 2023-05-28 LAB — TSH: TSH: 2.65 u[IU]/mL (ref 0.450–4.500)

## 2023-05-28 LAB — PRO B NATRIURETIC PEPTIDE: NT-Pro BNP: 545 pg/mL (ref 0–738)

## 2023-05-30 ENCOUNTER — Telehealth: Payer: Self-pay | Admitting: *Deleted

## 2023-05-30 NOTE — Telephone Encounter (Signed)
-----   Message from Flossie Dibble sent at 05/30/2023  8:53 AM EDT ----- Labs normal, good results. Continue with current medication plan.

## 2023-05-30 NOTE — Telephone Encounter (Signed)
 Informed pt of lab results. Pt verbalized understanding and had no further questions.

## 2023-05-31 ENCOUNTER — Telehealth: Payer: Self-pay | Admitting: *Deleted

## 2023-05-31 NOTE — Telephone Encounter (Signed)
Spoke to pt who wishes to postpone ablation at this time.  She would prefer to wait until next year.  She will call office to arrange follow up with Dr. Elberta Fortis later this year. Ablation procedure cancelled and appropriate personnel notified.

## 2023-07-25 ENCOUNTER — Other Ambulatory Visit (HOSPITAL_COMMUNITY): Payer: Medicare PPO

## 2023-08-01 ENCOUNTER — Encounter (HOSPITAL_COMMUNITY): Payer: Self-pay

## 2023-08-01 ENCOUNTER — Ambulatory Visit (HOSPITAL_COMMUNITY): Admit: 2023-08-01 | Payer: Medicare PPO | Admitting: Cardiology

## 2023-08-01 SURGERY — ATRIAL FIBRILLATION ABLATION
Anesthesia: General

## 2023-08-11 ENCOUNTER — Ambulatory Visit: Payer: Medicare PPO | Attending: Cardiology | Admitting: Cardiology

## 2024-01-05 ENCOUNTER — Encounter: Payer: Self-pay | Admitting: Emergency Medicine

## 2024-05-21 ENCOUNTER — Ambulatory Visit: Attending: Cardiology | Admitting: Cardiology

## 2024-05-21 NOTE — Progress Notes (Deleted)
  Electrophysiology Office Note:   Date:  05/21/2024  ID:  REGENIA ERCK, DOB 03-22-1946, MRN [id number]  Primary Cardiologist: Dub Huntsman, DO Primary Heart Failure: None Electrophysiologist: Tallula Grindle Gladis Norton, MD  {Click to update primary MD,subspecialty MD or APP then REFRESH:1}    History of Present Illness:   Penny Hines is a 78 y.o. female with h/o hypertension, rheumatoid arthritis, hyperlipidemia, obesity, atrial fibrillation seen today for routine electrophysiology followup.   Since last being seen in our clinic the patient reports doing ***.  she denies chest pain, palpitations, dyspnea, PND, orthopnea, nausea, vomiting, dizziness, syncope, edema, weight gain, or early satiety.   Review of systems complete and found to be negative unless listed in HPI.   EP Information / Studies Reviewed:    {EKGtoday:28818}       Risk Assessment/Calculations:    CHA2DS2-VASc Score = 4  {Confirm score is correct.  If not, click here to update score.  REFRESH note.  :1} This indicates a 4.8% annual risk of stroke. The patient's score is based upon: CHF History: 0 HTN History: 1 Diabetes History: 0 Stroke History: 0 Vascular Disease History: 0 Age Score: 2 Gender Score: 1   {This patient has a significant risk of stroke if diagnosed with atrial fibrillation.  Please consider VKA or DOAC agent for anticoagulation if the bleeding risk is acceptable.   You can also use the SmartPhrase .[email or web address] for documentation.   :[id number]}         Physical Exam:   VS:  There were no vitals taken for this visit.   Wt Readings from Last 3 Encounters:  05/27/23 183 lb (83 kg)  03/14/23 183 lb 3.2 oz (83.1 kg)  01/20/23 185 lb (83.9 kg)     GEN: Well nourished, well developed in no acute distress NECK: No JVD; No carotid bruits CARDIAC: {EPRHYTHM:28826}, no murmurs, rubs, gallops RESPIRATORY:  Clear to auscultation without rales, wheezing or rhonchi  ABDOMEN: Soft, non-tender,  non-distended EXTREMITIES:  No edema; No deformity   ASSESSMENT AND PLAN:    1.  Persistent atrial fibrillation: Post ablation 04/29/2021.  She had recurrence of atrial fibrillation was scheduled for repeat ablation but this was canceled.  On amiodarone .***  2.  Secondary hypercoagulable state: On Eliquis   3.  High-risk medication monitoring: On amiodarone .  Kylani Wires check TSH and LFTs today.  4.  Hypertension:***  Follow up with {EPMDS:28135::EP Team} {EPFOLLOW LE:71826}  Signed, Oneill Bais Gladis Norton, MD
